# Patient Record
Sex: Female | Born: 1975 | Hispanic: Yes | Marital: Married | Smoking: Never smoker
Health system: Southern US, Community
[De-identification: ages and names within clinical notes are randomized; demographics above are authoritative.]

## PROBLEM LIST (undated history)

## (undated) DIAGNOSIS — J069 Acute upper respiratory infection, unspecified: Secondary | ICD-10-CM

## (undated) DIAGNOSIS — K219 Gastro-esophageal reflux disease without esophagitis: Secondary | ICD-10-CM

## (undated) DIAGNOSIS — T7840XA Allergy, unspecified, initial encounter: Secondary | ICD-10-CM

## (undated) DIAGNOSIS — R011 Cardiac murmur, unspecified: Secondary | ICD-10-CM

## (undated) DIAGNOSIS — J45909 Unspecified asthma, uncomplicated: Secondary | ICD-10-CM

## (undated) DIAGNOSIS — Z87898 Personal history of other specified conditions: Secondary | ICD-10-CM

## (undated) DIAGNOSIS — G43909 Migraine, unspecified, not intractable, without status migrainosus: Secondary | ICD-10-CM

## (undated) DIAGNOSIS — K589 Irritable bowel syndrome without diarrhea: Secondary | ICD-10-CM

## (undated) DIAGNOSIS — L509 Urticaria, unspecified: Secondary | ICD-10-CM

## (undated) HISTORY — DX: Gastro-esophageal reflux disease without esophagitis: K21.9

## (undated) HISTORY — PX: CYSTECTOMY: SUR359

## (undated) HISTORY — DX: Migraine, unspecified, not intractable, without status migrainosus: G43.909

## (undated) HISTORY — DX: Acute upper respiratory infection, unspecified: J06.9

## (undated) HISTORY — DX: Cardiac murmur, unspecified: R01.1

## (undated) HISTORY — DX: Unspecified asthma, uncomplicated: J45.909

## (undated) HISTORY — DX: Irritable bowel syndrome, unspecified: K58.9

## (undated) HISTORY — DX: Allergy, unspecified, initial encounter: T78.40XA

## (undated) HISTORY — PX: WISDOM TOOTH EXTRACTION: SHX21

## (undated) HISTORY — DX: Personal history of other specified conditions: Z87.898

## (undated) HISTORY — DX: Urticaria, unspecified: L50.9

---

## 1998-01-26 ENCOUNTER — Emergency Department (HOSPITAL_COMMUNITY): Admission: EM | Admit: 1998-01-26 | Discharge: 1998-01-26 | Payer: Self-pay | Admitting: Emergency Medicine

## 1998-01-27 ENCOUNTER — Inpatient Hospital Stay (HOSPITAL_COMMUNITY): Admission: RE | Admit: 1998-01-27 | Discharge: 1998-01-27 | Payer: Self-pay | Admitting: *Deleted

## 1998-02-05 ENCOUNTER — Ambulatory Visit (HOSPITAL_COMMUNITY): Admission: RE | Admit: 1998-02-05 | Discharge: 1998-02-05 | Payer: Self-pay | Admitting: Obstetrics

## 1999-03-04 ENCOUNTER — Other Ambulatory Visit: Admission: RE | Admit: 1999-03-04 | Discharge: 1999-03-04 | Payer: Self-pay | Admitting: *Deleted

## 1999-11-17 ENCOUNTER — Encounter: Admission: RE | Admit: 1999-11-17 | Discharge: 1999-11-17 | Payer: Self-pay | Admitting: Family Medicine

## 1999-11-17 ENCOUNTER — Encounter: Payer: Self-pay | Admitting: Family Medicine

## 2000-10-19 ENCOUNTER — Other Ambulatory Visit: Admission: RE | Admit: 2000-10-19 | Discharge: 2000-10-19 | Payer: Self-pay | Admitting: *Deleted

## 2002-05-15 ENCOUNTER — Encounter (HOSPITAL_COMMUNITY): Admission: AD | Admit: 2002-05-15 | Discharge: 2002-05-17 | Payer: Self-pay | Admitting: Obstetrics

## 2002-05-17 ENCOUNTER — Inpatient Hospital Stay (HOSPITAL_COMMUNITY): Admission: AD | Admit: 2002-05-17 | Discharge: 2002-05-19 | Payer: Self-pay | Admitting: Obstetrics

## 2003-03-17 ENCOUNTER — Encounter: Admission: RE | Admit: 2003-03-17 | Discharge: 2003-03-17 | Payer: Self-pay | Admitting: Family Medicine

## 2003-03-17 ENCOUNTER — Encounter: Payer: Self-pay | Admitting: Family Medicine

## 2003-05-29 ENCOUNTER — Encounter: Admission: RE | Admit: 2003-05-29 | Discharge: 2003-05-29 | Payer: Self-pay | Admitting: Family Medicine

## 2003-05-29 ENCOUNTER — Encounter: Payer: Self-pay | Admitting: Family Medicine

## 2004-08-27 ENCOUNTER — Encounter: Admission: RE | Admit: 2004-08-27 | Discharge: 2004-08-27 | Payer: Self-pay | Admitting: Family Medicine

## 2007-11-30 ENCOUNTER — Encounter: Admission: RE | Admit: 2007-11-30 | Discharge: 2007-11-30 | Payer: Self-pay | Admitting: Family Medicine

## 2008-02-05 ENCOUNTER — Encounter: Admission: RE | Admit: 2008-02-05 | Discharge: 2008-02-05 | Payer: Self-pay | Admitting: Family Medicine

## 2009-08-08 HISTORY — PX: ABDOMINAL HYSTERECTOMY: SHX81

## 2010-02-02 ENCOUNTER — Ambulatory Visit: Payer: Self-pay | Admitting: Nurse Practitioner

## 2010-03-09 ENCOUNTER — Encounter (INDEPENDENT_AMBULATORY_CARE_PROVIDER_SITE_OTHER): Payer: Self-pay | Admitting: Obstetrics and Gynecology

## 2010-03-09 ENCOUNTER — Ambulatory Visit (HOSPITAL_COMMUNITY): Admission: RE | Admit: 2010-03-09 | Discharge: 2010-03-10 | Payer: Self-pay | Admitting: Obstetrics and Gynecology

## 2010-04-09 LAB — HM PAP SMEAR: HM PAP: 0

## 2010-06-09 LAB — HM MAMMOGRAPHY: HM Mammogram: 0

## 2010-08-03 ENCOUNTER — Ambulatory Visit: Payer: Self-pay | Admitting: Nurse Practitioner

## 2010-09-17 ENCOUNTER — Ambulatory Visit (HOSPITAL_BASED_OUTPATIENT_CLINIC_OR_DEPARTMENT_OTHER): Payer: 59 | Attending: Family Medicine

## 2010-09-17 DIAGNOSIS — G4730 Sleep apnea, unspecified: Secondary | ICD-10-CM | POA: Insufficient documentation

## 2010-09-17 DIAGNOSIS — G4700 Insomnia, unspecified: Secondary | ICD-10-CM | POA: Insufficient documentation

## 2010-09-18 DIAGNOSIS — G4700 Insomnia, unspecified: Secondary | ICD-10-CM

## 2010-09-18 DIAGNOSIS — G4730 Sleep apnea, unspecified: Secondary | ICD-10-CM

## 2010-10-05 ENCOUNTER — Encounter: Payer: Self-pay | Admitting: Nurse Practitioner

## 2010-10-19 ENCOUNTER — Encounter (INDEPENDENT_AMBULATORY_CARE_PROVIDER_SITE_OTHER): Payer: 59 | Admitting: Nurse Practitioner

## 2010-10-19 DIAGNOSIS — G43109 Migraine with aura, not intractable, without status migrainosus: Secondary | ICD-10-CM

## 2010-10-22 LAB — CBC
Hemoglobin: 10.9 g/dL — ABNORMAL LOW (ref 12.0–15.0)
MCH: 33.4 pg (ref 26.0–34.0)
Platelets: 133 10*3/uL — ABNORMAL LOW (ref 150–400)
RBC: 3.27 MIL/uL — ABNORMAL LOW (ref 3.87–5.11)
WBC: 14.1 10*3/uL — ABNORMAL HIGH (ref 4.0–10.5)

## 2010-10-22 LAB — PREGNANCY, URINE: Preg Test, Ur: NEGATIVE

## 2010-10-23 LAB — CBC
Hemoglobin: 13.3 g/dL (ref 12.0–15.0)
Platelets: 148 10*3/uL — ABNORMAL LOW (ref 150–400)
RBC: 4.01 MIL/uL (ref 3.87–5.11)
WBC: 5.8 10*3/uL (ref 4.0–10.5)

## 2010-10-23 LAB — SURGICAL PCR SCREEN
MRSA, PCR: NEGATIVE
Staphylococcus aureus: NEGATIVE

## 2010-12-21 NOTE — Assessment & Plan Note (Signed)
NAMEIDAMAE, COCCIA NO.:  [id number]   MEDICAL RECORD NO.:  [id number]         PATIENT TYPE:  WAMB   LOCATION:  [facility] at [facility]          FACILITY:  [facility]   PHYSICIAN:  Allie Bossier, MD        DATE OF BIRTH:  Jan 03, 1976   DATE OF SERVICE:  02/02/2010                                  CLINIC NOTE   The patient comes to office today for consultation on her migraine  headaches.  The patient has been having migraine without aura for  approximately 2 years.  She remembers just having tension headaches  prior to that.  When we reviewed her history, she has been having  headaches for longer than that, and she had a CT scan in 2009.  Her  headaches are located in both temples, right predominantly, as well as  the occipital region.  She has approximately 1-2 severe per month, 9  moderate per month, and the rest of the days, she has a mild.  She does  have an everyday headache.  She has been taking Relpax, ibuprofen for  her headaches.   PAST MEDICAL HISTORY:  She has asthma, heart disease, murmur.   She takes QVAR and Proventil   SOCIAL HISTORY:  She works outside of the home.  She has 2 children.  She is married, has 2 jobs.  For birth control, the patient uses  vasectomy.   Sleep is an issue for this patient.  She goes to sleep at 10:30, but she  perseverates over her the things that she must do the next day and often  is up until 1 a.m., sometimes as late as 3 a.m.  She then falls asleep  and feels unable to get up in the morning, having to switch off all  alarm clocks.  Her menstrual cycle is irregular, but light.   ALLERGIES:  To SHELLFISH.   First day of last menstrual period January 26, 2010.   PHYSICAL EXAMINATION:  GENERAL:  Well-developed, well-nourished 34-year-  old [place] female, in no acute distress.  The patient is alert,  oriented.  She is not anxious.  She is well coordinated with good muscle  tone and sensation.  HEENT:  Head is  normocephalic and atraumatic.  Pupils equal and  reactive.  CARDIAC:  Regular rate and rhythm.  LUNGS:  Clear bilaterally.   ASSESSMENT:  1. Chronic daily headache.  2. Migraine without aura.  3. Insomnia.   We had a very lengthy discussion today.  Demetri does not like to take  medications on a daily basis and feels that she does that very poorly.  We talked for a very long time about treating her insomnia in the hopes  that may end her daily headaches.  With that in mind, we are going to  trial her on Ambien 10 mg 1/2-1 tablet q.h.s. #20 with 2 refills, and  she will be given a prescription today for a refill on her Relpax.  We  will give her exactly 1 month to see if this works.  If it does not  work, then at her next office visit, we will have to insist that she  start on a headache preventative.  She will return to the clinic in 4  weeks if not sooner.      Remonia Richter, NP    ______________________________  Allie Bossier, MD    LR/MEDQ  D:  02/02/2010  T:  02/03/2010  Job:  [id number]

## 2010-12-21 NOTE — Assessment & Plan Note (Signed)
Lorraine, Wu NO.:  [id number]   MEDICAL RECORD NO.:  [id number]          PATIENT TYPE:  POB   LOCATION:  [facility] at [facility]         FACILITY:  [facility]   PHYSICIAN:  Lorraine Bossier, MD        DATE OF BIRTH:  1976/05/03   DATE OF SERVICE:  08/03/2010                                  CLINIC NOTE   The patient comes to the office today for follow up on her migraine  headaches.  The patient was last seen on February 02, 2010, for her original  consultation.  Since then, she has had a hysterectomy in August 2011.  She has her tubes and her ovaries remaining.  She has not had any  hormone replacement.  She did not feel that the Ambien was particularly  helpful.  It did not work at 5 mg, it worked once at 10 mg and did not  work later at 10 mg.  She did do research study.  She feels that in a 1-  month period she has three severe headaches.  Those headaches seem to  last for several days, ongoing.  She has a little bit of difficulty  describing the length of her headache at days.  It could be that she is  not completely ending her headaches.  She may be under treating.   PHYSICAL EXAMINATION:  VITAL SIGNS:  Blood pressure is 112/57, pulse 62,  weight 124, height is 5 feet.  GENERAL:  Well-developed, well-nourished 35 year old Hispanic female in  no acute distress.  HEENT:  Head is normocephalic and atraumatic.  Pupils equal and react.  NEUROLOGIC:  The patient is alert, oriented.  She has good muscle tone  and coordination and sensation.   ASSESSMENT:  1. Migraine.  2. Insomnia.   PLAN:  1. From the perspective of her headaches, it appears that she is under      treating her headaches.  She is encouraged to be more aggressive in      treating her headaches and adding Motrin to her Relpax.  We had a      good discussion on this today, and I believe she understands the      importance of early treatment.  We will also give her Zanaflex 4 mg      one-half to 1  tablet q.4 h. p.r.n. muscle spasm #30 with one      refill.  The patient will return to the office in 8 weeks or sooner      as needed.  2. Insomnia.  We will order a sleep study today.  She will set that up      at her convenience.      Lorraine Richter, NP    ______________________________  Lorraine Bossier, MD    LR/MEDQ  D:  08/03/2010  T:  08/04/2010  Job:  [id number]

## 2011-02-18 NOTE — Assessment & Plan Note (Unsigned)
Lorraine Wu, PARISI NO.:  [id number]  MEDICAL RECORD NO.:  [id number]          PATIENT TYPE:  POB  LOCATION:  [facility] at [facility]         FACILITY:  [facility]  PHYSICIAN:  Allie Bossier, MD        DATE OF BIRTH:  1976/06/14  DATE OF SERVICE:  10/19/2010                                 CLINIC NOTE  The patient comes to the office today for a followup on her migraine headaches.  The patient was last seen in December 2011.  Since then she has gotten her sleep study done which was normal.  She has been more aggressively treating her headaches and that has worked well for her. She has only had 2 severe headaches in the last 1 month.  She feels that it has again significantly helped to treat early.  She has only had to use Zanaflex 3 times in the last month to treat some muscle spasm. Overall she is doing very well.  PHYSICAL EXAM:  VITAL SIGNS:  Well-developed, well-nourished, 35 year old Hispanic female in no acute distress. VITAL SIGNS:  Blood pressure is 105/65, pulse 71. HEENT:  Head is normocephalic and atraumatic.  Pupils equal and react. NEUROLOGIC:  The patient is alert, oriented with good coordination and good sensation and good muscle tone.  ASSESSMENT:  Migraine.  PLAN:  The patient will get a refill on her Relpax 40 mg 1 p.o. p.r.n. migraine, #9 with 11 refills.  She again is applauded for doing very well and seems to have learned how to manage her headaches.  We will see her now on a yearly basis unless she needs to be seen sooner.     Remonia Richter, NP   ______________________________ Allie Bossier, MD   LR/MEDQ  D:  10/19/2010  T:  10/19/2010  Job:  [id number]

## 2011-03-30 ENCOUNTER — Telehealth: Payer: Self-pay

## 2011-03-30 NOTE — Telephone Encounter (Signed)
Bonita Quin wants [place] to call in some rescue meds for this patient this is what she would like called in Vicodin ES 1 po prn migraine #20 no refills, Phenergan 25 mg 1 po x 6 hrs prn severe migraine #20 0 refills. Please call them in to [facility].

## 2011-03-30 NOTE — Telephone Encounter (Signed)
Meds were called in as directed to Rob at [facility].

## 2011-09-13 ENCOUNTER — Telehealth: Payer: Self-pay

## 2011-09-13 NOTE — Telephone Encounter (Signed)
Patient put in a request a week before christmas 2012 and ask that we fax her employer a copy of her tither and proof of her mmr booster please fax this info to [phone] her employer is [employer]

## 2012-01-31 ENCOUNTER — Ambulatory Visit: Payer: Self-pay | Admitting: Obstetrics and Gynecology

## 2012-05-16 ENCOUNTER — Telehealth: Payer: Self-pay

## 2012-05-16 NOTE — Telephone Encounter (Signed)
Patient called to make a fu appointment with Lorraine Wu she is scheduled for 06/19/12 but she is running out of her meds. Requested a refill just to get her through to her appointment. Per Inetta Fermo we called in Relpax 40 mg #9 no refills and Ibuprofen 800 mg # 60 no refills. Called it in to [place].

## 2012-06-09 LAB — HM MAMMOGRAPHY: HM MAMMO: 0

## 2012-06-19 ENCOUNTER — Ambulatory Visit (INDEPENDENT_AMBULATORY_CARE_PROVIDER_SITE_OTHER): Payer: 59 | Admitting: Nurse Practitioner

## 2012-06-19 ENCOUNTER — Encounter: Payer: Self-pay | Admitting: Nurse Practitioner

## 2012-06-19 VITALS — BP 109/62 | HR 66 | Ht 60.0 in | Wt 130.0 lb

## 2012-06-19 DIAGNOSIS — M542 Cervicalgia: Secondary | ICD-10-CM

## 2012-06-19 DIAGNOSIS — H5710 Ocular pain, unspecified eye: Secondary | ICD-10-CM

## 2012-06-19 DIAGNOSIS — G43909 Migraine, unspecified, not intractable, without status migrainosus: Secondary | ICD-10-CM

## 2012-06-19 DIAGNOSIS — G43009 Migraine without aura, not intractable, without status migrainosus: Secondary | ICD-10-CM | POA: Insufficient documentation

## 2012-06-19 MED ORDER — VENLAFAXINE HCL ER 75 MG PO CP24: 75.0000 mg | ORAL_CAPSULE | Freq: Every day | ORAL | Status: DC

## 2012-06-19 MED ORDER — ELETRIPTAN HYDROBROMIDE 40 MG PO TABS: 40.0000 mg | ORAL_TABLET | ORAL | Status: DC | PRN

## 2012-06-19 NOTE — Patient Instructions (Addendum)
Migraine Headache A migraine headache is an intense, throbbing pain on one or both sides of your head. A migraine can last for 30 minutes to several hours. CAUSES  The exact cause of a migraine headache is not always known. However, a migraine may be caused when nerves in the brain become irritated and release chemicals that cause inflammation. This causes pain. SYMPTOMS  Pain on one or both sides of your head.  Pulsating or throbbing pain.  Severe pain that prevents daily activities.  Pain that is aggravated by any physical activity.  Nausea, vomiting, or both.  Dizziness.  Pain with exposure to bright lights, loud noises, or activity.  General sensitivity to bright lights, loud noises, or smells. Before you get a migraine, you may get warning signs that a migraine is coming (aura). An aura may include:  Seeing flashing lights.  Seeing bright spots, halos, or zig-zag lines.  Having tunnel vision or blurred vision.  Having feelings of numbness or tingling.  Having trouble talking.  Having muscle weakness. MIGRAINE TRIGGERS  Alcohol.  Smoking.  Stress.  Menstruation.  Aged cheeses.  Foods or drinks that contain nitrates, glutamate, aspartame, or tyramine.  Lack of sleep.  Chocolate.  Caffeine.  Hunger.  Physical exertion.  Fatigue.  Medicines used to treat chest pain (nitroglycerine), birth control pills, estrogen, and some blood pressure medicines. DIAGNOSIS  A migraine headache is often diagnosed based on:  Symptoms.  Physical examination.  A CT scan or MRI of your head. TREATMENT Medicines may be given for pain and nausea. Medicines can also be given to help prevent recurrent migraines.  HOME CARE INSTRUCTIONS  Only take over-the-counter or prescription medicines for pain or discomfort as directed by your caregiver. The use of long-term narcotics is not recommended.  Lie down in a dark, quiet room when you have a migraine.  Keep a journal  to find out what may trigger your migraine headaches. For example, write down:  What you eat and drink.  How much sleep you get.  Any change to your diet or medicines.  Limit alcohol consumption.  Quit smoking if you smoke.  Get 7 to 9 hours of sleep, or as recommended by your caregiver.  Limit stress.  Keep lights dim if bright lights bother you and make your migraines worse. SEEK IMMEDIATE MEDICAL CARE IF:   Your migraine becomes severe.  You have a fever.  You have a stiff neck.  You have vision loss.  You have muscular weakness or loss of muscle control.  You start losing your balance or have trouble walking.  You feel faint or pass out.  You have severe symptoms that are different from your first symptoms. MAKE SURE YOU:   Understand these instructions.  Will watch your condition.  Will get help right away if you are not doing well or get worse. Document Released: 07/25/2005 Document Revised: 10/17/2011 Document Reviewed: 07/15/2011 ExitCare Patient Information 2013 [employer].  

## 2012-06-19 NOTE — Progress Notes (Signed)
S: Pt is here for follow up on migraine headaches. She has not been seen in over one year. She was doing well up until a few months ago when she and her husband separated and her house is going on the market and she is planning to quit her job and move away. She has been taking Zanaflex prn. She wants to take a daily preventative and would like trigger point injections today.  O: Head and neck: Bilateral occipital pain, traps bilateral tenderness  Procedure: Trigger point injections: 5cc total, 2cc lidocaine, 2 cc marcaine, 1 cc prednisone. Approximately 1 cc injected in each area of bilar occipital nerves and bilat traps.Pt tolerated procedure well.  A: Migraine  Occipital Nerve Pain  P: Refill Relpax Start Effexor 75 mg daily Trigger point injections today/ ice tonight RTC 6 weeks

## 2012-07-24 ENCOUNTER — Encounter: Payer: Self-pay | Admitting: Nurse Practitioner

## 2012-07-24 ENCOUNTER — Ambulatory Visit (INDEPENDENT_AMBULATORY_CARE_PROVIDER_SITE_OTHER): Payer: 59 | Admitting: Nurse Practitioner

## 2012-07-24 VITALS — BP 104/64 | HR 64 | Ht 60.0 in | Wt 127.0 lb

## 2012-07-24 DIAGNOSIS — G43909 Migraine, unspecified, not intractable, without status migrainosus: Secondary | ICD-10-CM

## 2012-07-24 MED ORDER — VENLAFAXINE HCL ER 75 MG PO CP24: 75.0000 mg | ORAL_CAPSULE | Freq: Every day | ORAL | Status: DC

## 2012-07-24 NOTE — Progress Notes (Signed)
S: Pt is in office today to follow up on migraine headaches. She felt the trigger point injections helped to break her headache cycle.  She has done very well with Effexor as a preventative . She has not had to use her Relpax at all since starting it. It does cause her some nausea and we discussed her taking it with supper. It seems she and her husband are trying to work out their problems now.   O: Alert, oriented, NAD Neuro: Negative  A: Migraine  P: Refill Effexor 75mg  daily Continue Relpax as needed RTC one year or prn

## 2012-07-24 NOTE — Patient Instructions (Signed)
Migraine Headache A migraine headache is an intense, throbbing pain on one or both sides of your head. A migraine can last for 30 minutes to several hours. CAUSES  The exact cause of a migraine headache is not always known. However, a migraine may be caused when nerves in the brain become irritated and release chemicals that cause inflammation. This causes pain. SYMPTOMS  Pain on one or both sides of your head.  Pulsating or throbbing pain.  Severe pain that prevents daily activities.  Pain that is aggravated by any physical activity.  Nausea, vomiting, or both.  Dizziness.  Pain with exposure to bright lights, loud noises, or activity.  General sensitivity to bright lights, loud noises, or smells. Before you get a migraine, you may get warning signs that a migraine is coming (aura). An aura may include:  Seeing flashing lights.  Seeing bright spots, halos, or zig-zag lines.  Having tunnel vision or blurred vision.  Having feelings of numbness or tingling.  Having trouble talking.  Having muscle weakness. MIGRAINE TRIGGERS  Alcohol.  Smoking.  Stress.  Menstruation.  Aged cheeses.  Foods or drinks that contain nitrates, glutamate, aspartame, or tyramine.  Lack of sleep.  Chocolate.  Caffeine.  Hunger.  Physical exertion.  Fatigue.  Medicines used to treat chest pain (nitroglycerine), birth control pills, estrogen, and some blood pressure medicines. DIAGNOSIS  A migraine headache is often diagnosed based on:  Symptoms.  Physical examination.  A CT scan or MRI of your head. TREATMENT Medicines may be given for pain and nausea. Medicines can also be given to help prevent recurrent migraines.  HOME CARE INSTRUCTIONS  Only take over-the-counter or prescription medicines for pain or discomfort as directed by your caregiver. The use of long-term narcotics is not recommended.  Lie down in a dark, quiet room when you have a migraine.  Keep a journal  to find out what may trigger your migraine headaches. For example, write down:  What you eat and drink.  How much sleep you get.  Any change to your diet or medicines.  Limit alcohol consumption.  Quit smoking if you smoke.  Get 7 to 9 hours of sleep, or as recommended by your caregiver.  Limit stress.  Keep lights dim if bright lights bother you and make your migraines worse. SEEK IMMEDIATE MEDICAL CARE IF:   Your migraine becomes severe.  You have a fever.  You have a stiff neck.  You have vision loss.  You have muscular weakness or loss of muscle control.  You start losing your balance or have trouble walking.  You feel faint or pass out.  You have severe symptoms that are different from your first symptoms. MAKE SURE YOU:   Understand these instructions.  Will watch your condition.  Will get help right away if you are not doing well or get worse. Document Released: 07/25/2005 Document Revised: 10/17/2011 Document Reviewed: 07/15/2011 ExitCare Patient Information 2013 [employer].  

## 2012-09-27 ENCOUNTER — Telehealth: Payer: Self-pay | Admitting: *Deleted

## 2012-09-27 ENCOUNTER — Telehealth: Payer: Self-pay

## 2012-09-27 DIAGNOSIS — G43909 Migraine, unspecified, not intractable, without status migrainosus: Secondary | ICD-10-CM

## 2012-09-27 MED ORDER — PROMETHAZINE HCL 25 MG PO TABS: 25.0000 mg | ORAL_TABLET | Freq: Four times a day (QID) | ORAL | Status: DC | PRN

## 2012-09-27 NOTE — Telephone Encounter (Signed)
Patient is having a bad migraine that is causing nausea and vomiting, Lorraine Wu has authorized phenergan 25 mg to be called into her pharmacy.  Patients son will pick up rx for her.

## 2012-09-27 NOTE — Telephone Encounter (Signed)
Called in Imitrex to her [facility] pharmacy per [employer]

## 2013-02-14 ENCOUNTER — Other Ambulatory Visit: Payer: Self-pay | Admitting: *Deleted

## 2013-02-14 ENCOUNTER — Telehealth: Payer: Self-pay | Admitting: *Deleted

## 2013-02-14 DIAGNOSIS — G43909 Migraine, unspecified, not intractable, without status migrainosus: Secondary | ICD-10-CM

## 2013-02-14 MED ORDER — IBUPROFEN 800 MG PO TABS: 800.0000 mg | ORAL_TABLET | Freq: Three times a day (TID) | ORAL | Status: DC | PRN

## 2013-02-14 NOTE — Telephone Encounter (Signed)
Refill req from pharm rcvd today via fax - sent 1 refill auth to pharm with no refills

## 2013-02-14 NOTE — Telephone Encounter (Signed)
error 

## 2013-05-02 ENCOUNTER — Other Ambulatory Visit: Payer: Self-pay | Admitting: Plastic Surgery

## 2013-05-02 DIAGNOSIS — L723 Sebaceous cyst: Secondary | ICD-10-CM

## 2014-01-14 ENCOUNTER — Encounter: Payer: Self-pay | Admitting: Nurse Practitioner

## 2014-01-14 ENCOUNTER — Ambulatory Visit (INDEPENDENT_AMBULATORY_CARE_PROVIDER_SITE_OTHER): Payer: 59 | Admitting: Nurse Practitioner

## 2014-01-14 VITALS — BP 114/73 | HR 66 | Ht 60.0 in | Wt 146.0 lb

## 2014-01-14 DIAGNOSIS — G43909 Migraine, unspecified, not intractable, without status migrainosus: Secondary | ICD-10-CM

## 2014-01-14 DIAGNOSIS — G43009 Migraine without aura, not intractable, without status migrainosus: Secondary | ICD-10-CM

## 2014-01-14 MED ORDER — PROMETHAZINE HCL 25 MG PO TABS: 25.0000 mg | ORAL_TABLET | Freq: Four times a day (QID) | ORAL | Status: DC | PRN

## 2014-01-14 MED ORDER — KETOROLAC TROMETHAMINE 60 MG/2ML IM SOLN
60.0000 mg | Freq: Once | INTRAMUSCULAR | Status: AC
  Administered 2014-01-14: 60 mg via INTRAMUSCULAR

## 2014-01-14 MED ORDER — RIZATRIPTAN BENZOATE 10 MG PO TABS: 10.0000 mg | ORAL_TABLET | ORAL | Status: DC | PRN

## 2014-01-14 NOTE — Patient Instructions (Signed)
Migraine Headache A migraine headache is an intense, throbbing pain on one or both sides of your head. A migraine can last for 30 minutes to several hours. CAUSES  The exact cause of a migraine headache is not always known. However, a migraine may be caused when nerves in the brain become irritated and release chemicals that cause inflammation. This causes pain. Certain things may also trigger migraines, such as:  Alcohol.  Smoking.  Stress.  Menstruation.  Aged cheeses.  Foods or drinks that contain nitrates, glutamate, aspartame, or tyramine.  Lack of sleep.  Chocolate.  Caffeine.  Hunger.  Physical exertion.  Fatigue.  Medicines used to treat chest pain (nitroglycerine), birth control pills, estrogen, and some blood pressure medicines. SIGNS AND SYMPTOMS  Pain on one or both sides of your head.  Pulsating or throbbing pain.  Severe pain that prevents daily activities.  Pain that is aggravated by any physical activity.  Nausea, vomiting, or both.  Dizziness.  Pain with exposure to bright lights, loud noises, or activity.  General sensitivity to bright lights, loud noises, or smells. Before you get a migraine, you may get warning signs that a migraine is coming (aura). An aura may include:  Seeing flashing lights.  Seeing bright spots, halos, or zig-zag lines.  Having tunnel vision or blurred vision.  Having feelings of numbness or tingling.  Having trouble talking.  Having muscle weakness. DIAGNOSIS  A migraine headache is often diagnosed based on:  Symptoms.  Physical exam.  A CT scan or MRI of your head. These imaging tests cannot diagnose migraines, but they can help rule out other causes of headaches. TREATMENT Medicines may be given for pain and nausea. Medicines can also be given to help prevent recurrent migraines.  HOME CARE INSTRUCTIONS  Only take over-the-counter or prescription medicines for pain or discomfort as directed by your  health care provider. The use of long-term narcotics is not recommended.  Lie down in a dark, quiet room when you have a migraine.  Keep a journal to find out what may trigger your migraine headaches. For example, write down:  What you eat and drink.  How much sleep you get.  Any change to your diet or medicines.  Limit alcohol consumption.  Quit smoking if you smoke.  Get 7 9 hours of sleep, or as recommended by your health care provider.  Limit stress.  Keep lights dim if bright lights bother you and make your migraines worse. SEEK IMMEDIATE MEDICAL CARE IF:   Your migraine becomes severe.  You have a fever.  You have a stiff neck.  You have vision loss.  You have muscular weakness or loss of muscle control.  You start losing your balance or have trouble walking.  You feel faint or pass out.  You have severe symptoms that are different from your first symptoms. MAKE SURE YOU:   Understand these instructions.  Will watch your condition.  Will get help right away if you are not doing well or get worse. Document Released: 07/25/2005 Document Revised: 05/15/2013 Document Reviewed: 04/01/2013 ExitCare Patient Information 2014 [employer].  

## 2014-01-14 NOTE — Progress Notes (Signed)
History:  Lorraine Wu is a 38 y.o. [id number] who presents to [facility] clinic today for follow up on migraines. She has an acute migraine today.  She has not been seen in over one year due to being in migraine study. Things have been going well until about 2 months ago and she  Started having migraine daily or every other day. She has been limited by insurance company to 4 Imitrex tablets per month. She is interested in prevention. 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  The following portions of the patient's history were reviewed and updated as appropriate: allergies, current medications, past family history, past medical history, past social history, past surgical history and problem list.  Review of Systems:  Pertinent items are noted in HPI.  Objective:  Physical Exam BP 114/73  Pulse 66  Ht 5' (1.524 m)  Wt 146 lb (66.225 kg)  BMI 28.51 kg/m2 GENERAL: Well-developed, well-nourished female in no acute distress.  HEENT: Normocephalic, atraumatic.  NECK: Supple. Normal thyroid.  LUNGS: Normal rate. Clear to auscultation bilaterally.  HEART: Regular rate and rhythm with no adventitious sounds.  EXTREMITIES: No cyanosis, clubbing, or edema, 2+ distal pulses.   Labs and Imaging No results found.  Assessment & Plan:  Assessment:  Migraine without Aura  Plans:  Toradol 60 mg IM now Discussed preventatives and chose Trokendi. Will start with one week 25 mg and then up to 50 mg. Samples given. Refill Phenergan Discontinue Imitrex and start Maxalt Follow up in 3 months or prn  Delbert Phenix, NP 01/14/2014 4:36 PM

## 2014-01-27 ENCOUNTER — Telehealth: Payer: Self-pay | Admitting: *Deleted

## 2014-01-27 MED ORDER — TOPIRAMATE ER 50 MG PO CAP24: 50.0000 mg | ORAL_CAPSULE | Freq: Every day | ORAL | Status: DC

## 2014-01-27 NOTE — Telephone Encounter (Signed)
Patient called, her rx for trokendi did not go to the pharmacy and she would like for us to call this in for her.  I have called in a months supply with three refills.

## 2014-02-04 ENCOUNTER — Telehealth: Payer: Self-pay | Admitting: *Deleted

## 2014-02-04 NOTE — Telephone Encounter (Signed)
Lorraine Wu called to let us know that her insurance is not covering the Trokendi at all, since it is not covered the coupon does work either.  She wants to know if you have any suggestions for her because she does feel like it is working well so far.

## 2014-02-06 ENCOUNTER — Telehealth: Payer: Self-pay | Admitting: *Deleted

## 2014-02-06 DIAGNOSIS — G43911 Migraine, unspecified, intractable, with status migrainosus: Secondary | ICD-10-CM

## 2014-02-06 MED ORDER — TOPIRAMATE 25 MG PO CPSP: ORAL_CAPSULE | ORAL | Status: DC

## 2014-02-06 MED ORDER — TRAMADOL HCL 50 MG PO TABS: 50.0000 mg | ORAL_TABLET | Freq: Four times a day (QID) | ORAL | Status: DC | PRN

## 2014-02-06 NOTE — Telephone Encounter (Signed)
Patient is having a migraine and is not able to continue the Trokendi due to cost as it is not covered by her insurance.  [facility] and she has advised me to call in generic topamax for the patient and Ultram for her immediate headache.  I have left a message for the patient to call us if this does not work for her she can call the office for more options.

## 2014-04-22 ENCOUNTER — Encounter: Payer: 59 | Admitting: Nurse Practitioner

## 2014-05-15 ENCOUNTER — Other Ambulatory Visit: Payer: Self-pay | Admitting: Family Medicine

## 2014-05-15 DIAGNOSIS — R0789 Other chest pain: Secondary | ICD-10-CM

## 2014-05-20 ENCOUNTER — Encounter: Payer: Self-pay | Admitting: Nurse Practitioner

## 2014-05-20 ENCOUNTER — Ambulatory Visit (INDEPENDENT_AMBULATORY_CARE_PROVIDER_SITE_OTHER): Payer: 59 | Admitting: Nurse Practitioner

## 2014-05-20 VITALS — BP 111/76 | HR 73 | Ht 60.0 in | Wt 144.0 lb

## 2014-05-20 DIAGNOSIS — G43009 Migraine without aura, not intractable, without status migrainosus: Secondary | ICD-10-CM

## 2014-05-20 MED ORDER — LEVETIRACETAM 250 MG PO TABS: 250.0000 mg | ORAL_TABLET | Freq: Two times a day (BID) | ORAL | Status: DC

## 2014-05-20 NOTE — Patient Instructions (Signed)

## 2014-05-20 NOTE — Progress Notes (Signed)
History:  Kimberlee Nearingiryam Rosario-Riveras a 38 y.o. [id number] who presents to [facility] today for follow up with migraine headaches. She had side effects with Topamax including insomnia, and some peripheral shadowing. Her insurance will not pay for Trokendi. 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 Things are going well at home.   The following portions of the patient's history were reviewed and updated as appropriate: allergies, current medications, past family history, past medical history, past social history, past surgical history and problem list.  Review of Systems:    Objective:  Physical Exam BP 111/76  Pulse 73  Ht 5' (1.524 m)  Wt 144 lb (65.318 kg)  BMI 28.12 kg/m2 GENERAL: Well-developed, well-nourished female in no acute distress.  HEENT: Normocephalic, atraumatic.  NECK: Supple. Normal thyroid.  LUNGS: Normal rate. Clear to auscultation bilaterally. Small wheeze HEART: Regular rate and rhythm with no adventitious sounds. Has murmur EXTREMITIES: No cyanosis, clubbing, or edema, 2+ distal pulses.   Labs and Imaging No results found.  Assessment & Plan:  Assessment:  Migraine without Aura  Plans: Trial Keppra 250 mg po Q HS RTC 2 months or prn   Delbert PhenixLinda M Yeriel Mineo, NP 05/20/2014 3:18 PM

## 2014-05-23 ENCOUNTER — Ambulatory Visit
Admission: RE | Admit: 2014-05-23 | Discharge: 2014-05-23 | Disposition: A | Discharge status: Home / Self Care | Payer: 59 | Source: Ambulatory Visit | Attending: Family Medicine | Admitting: Family Medicine

## 2014-05-23 DIAGNOSIS — R0789 Other chest pain: Secondary | ICD-10-CM

## 2014-06-09 ENCOUNTER — Encounter: Payer: Self-pay | Admitting: Nurse Practitioner

## 2014-07-22 ENCOUNTER — Encounter: Payer: Self-pay | Admitting: Nurse Practitioner

## 2014-07-22 ENCOUNTER — Ambulatory Visit (INDEPENDENT_AMBULATORY_CARE_PROVIDER_SITE_OTHER): Payer: 59 | Admitting: Nurse Practitioner

## 2014-07-22 VITALS — BP 93/63 | HR 59 | Ht 60.0 in | Wt 144.8 lb

## 2014-07-22 DIAGNOSIS — G43009 Migraine without aura, not intractable, without status migrainosus: Secondary | ICD-10-CM

## 2014-07-22 MED ORDER — PROMETHAZINE HCL 25 MG RE SUPP: 25.0000 mg | Freq: Four times a day (QID) | RECTAL | Status: DC | PRN

## 2014-07-22 MED ORDER — SUMATRIPTAN SUCCINATE 100 MG PO TABS: 100.0000 mg | ORAL_TABLET | Freq: Once | ORAL | Status: DC | PRN

## 2014-07-22 MED ORDER — LEVETIRACETAM 250 MG PO TABS: 250.0000 mg | ORAL_TABLET | Freq: Two times a day (BID) | ORAL | Status: DC

## 2014-07-22 NOTE — Progress Notes (Signed)
History:  Lorraine Wu is a 38 y.o. [id number] who presents to clinic today for follow up on migraine. She has been taking the Keppra and doing very well. She only has 2 migraines last month. With one of those she was vomiting and unable to hold down phenergan.  She has started Creed for her IBS and that also seems to be working well. She is doing well in Nursing School.   The following portions of the patient's history were reviewed and updated as appropriate: allergies, current medications, past family history, past medical history, past social history, past surgical history and problem list.  Review of Systems:    Objective:  Physical Exam BP 93/63 mmHg  Pulse 59  Ht 5' (1.524 m)  Wt 144 lb 12.8 oz (65.681 kg)  BMI 28.28 kg/m2 GENERAL: Well-developed, well-nourished female in no acute distress.  HEENT: Normocephalic, atraumatic.  NECK: Supple. Normal thyroid.  LUNGS: Normal rate. Clear to auscultation bilaterally.  HEART: Regular rate and rhythm with no adventitious sounds.  EXTREMITIES: No cyanosis, clubbing, or edema, 2+ distal pulses.   Labs and Imaging No results found.  Assessment & Plan:  Assessment:  Migraine with ou Aura  Plans:  Add Phenergan suppositories Refill Keppra and Imitrex RTC 6 months or prn  Lorraine PhenixLinda M Elion Hocker, NP 07/22/2014 1:02 PM

## 2014-07-22 NOTE — Patient Instructions (Signed)

## 2014-07-22 NOTE — Progress Notes (Signed)
Patient is here to follow up on her headaches.  She has recently had a visit with a new gastroenterologist and placed on a new IBS medication called careen.  It is working well.

## 2014-08-12 ENCOUNTER — Ambulatory Visit: Payer: 59 | Admitting: Internal Medicine

## 2014-09-11 ENCOUNTER — Ambulatory Visit (INDEPENDENT_AMBULATORY_CARE_PROVIDER_SITE_OTHER): Payer: 59 | Admitting: Internal Medicine

## 2014-09-11 ENCOUNTER — Encounter: Payer: Self-pay | Admitting: Internal Medicine

## 2014-09-11 ENCOUNTER — Encounter (INDEPENDENT_AMBULATORY_CARE_PROVIDER_SITE_OTHER): Payer: Self-pay

## 2014-09-11 VITALS — BP 108/78 | HR 78 | Temp 98.1°F | Ht 60.5 in | Wt 142.0 lb

## 2014-09-11 DIAGNOSIS — G43019 Migraine without aura, intractable, without status migrainosus: Secondary | ICD-10-CM

## 2014-09-11 DIAGNOSIS — J4520 Mild intermittent asthma, uncomplicated: Secondary | ICD-10-CM

## 2014-09-11 DIAGNOSIS — E162 Hypoglycemia, unspecified: Secondary | ICD-10-CM

## 2014-09-11 DIAGNOSIS — K589 Irritable bowel syndrome without diarrhea: Secondary | ICD-10-CM

## 2014-09-11 NOTE — Assessment & Plan Note (Signed)
Albuterol prn She will let me know if albuterol use becomes more frequent or consistant

## 2014-09-11 NOTE — Progress Notes (Signed)
HPI  Pt presents to the clinic today to establish care and for management of the conditions listed below. She last saw her previous PCP [facility] physician Dr. Hyacinth Wu last year.  Pt reports frustration w/ Dr. Rondel Wu office so wanted to try new provider.    1. Migraine w/out aura; Keppra, tramadol - working well; phenergan supp used when waking up with migraine; tizanidine - stiffness during migraine 2. Asthma - intermittent. Ventolin use varies; some weeks not at all and others several times 3. Heart murmur -sometimes heard, sometimes not 4. Irritable Bowel Syndrome; Creon - working very well - has f/u with GI specialist Dr. Clerance Wu tomorrow 6. Hypoglycemia - reports AM episodes of dizziness, shakiness, and sweatiness; has had glucose monitored by nurse at work during episodes - readings of 58 and 62. She reports her first meal is not until about 10:30 or 11:00 because if she eats too early, she gets nauseated. Of note, pt reports her mother and sister both have DM.   Flu: 04/2014 - adm at work - Lorraine Wu. Tetanus: 5-6 years ago at Dr. Rondel Wu office Pneumo Vacc: 5-6 years ago due to asthmatic exacerbations LMP: 02/2010; G3P2 (12 yo son, 13 yo son) Pap Smear: 03/2010 - hysterectomy; has ovaries Last mammogram: 12/2011 Dentist: 07/2014 - cleaning every 6 mos  Review of Systems:   Past Medical History  Diagnosis Date  . Heart murmur   . Migraine   . History of insomnia   . Asthma     Family History  Problem Relation Age of Onset  . Depression Mother   . Hypertension Mother   . Diabetes Mother   . Anxiety disorder Mother   . Rheum arthritis Mother   . Cancer Mother     colon  . Bipolar disorder Mother   . Hypertension Father   . Heart disease Father     enlarge  . Hyperlipidemia Father   . Thyroid nodules Sister   . Diabetes Sister   . Hypertension Sister     History   Social History  . Marital Status: Married    Spouse Name: N/A    Number of Children: N/A  .  Years of Education: N/A   Occupational History  . Not on file.   Social History Main Topics  . Smoking status: Never Smoker   . Smokeless tobacco: Not on file  . Alcohol Use: 0.0 oz/week    0 Not specified per week     Comment: rare  . Drug Use: No  . Sexual Activity:    Partners: Male    Birth Control/ Protection: Surgical     Comment: hysterectomy   Other Topics Concern  . Not on file   Social History Narrative    Allergies  Allergen Reactions  . Shellfish Allergy Hives, Shortness Of Breath and Swelling  . Penicillins Hives and Swelling   Constitutional: Denies headache, fatigue, fever, and abrupt weight changes.  HEENT:  Denies sore throat, eye redness, eye pain, pressure behind the eyes, facial pain, nasal congestion, ear pain, ringing in the ears, wax buildup, runny nose or bloody nose. Respiratory: Denies cough, difficulty breathing or shortness of breath.  Cardiovascular: Denies chest pain, chest tightness, palpitations or swelling in the hands or feet. Endocrine: Positive shakiness and sweatiness.  Gastrointestinal: Denies abdominal pain, bloating, constipation, diarrhea or blood in the stool.  GU: Denies frequency, urgency, pain with urination, blood in urine, odor or vaginal discharge. Musculoskeletal: Denies decrease in range of motion, difficulty with  gait, muscle pain or joint pain and swelling.  Skin: Denies redness, rashes, lesions or ulcercations.  Neurological: Denies difficulty with memory, difficulty with speech or problems with balance and coordination.  Psych: Pt denies anxiety, depression, SI/HI.  No other specific complaints in a complete review of systems (except as listed in HPI above).  Objective:   BP 108/78 mmHg  Pulse 78  Temp(Src) 98.1 F (36.7 C) (Oral)  Ht 5' 0.5" (1.537 m)  Wt 142 lb (64.411 kg)  BMI 27.27 kg/m2  SpO2 99% Wt Readings from Last 3 Encounters:  09/11/14 142 lb (64.411 kg)  07/22/14 144 lb 12.8 oz (65.681 kg)   05/20/14 144 lb (65.318 kg)   General: [name] is pleasant, appears her stated age, well developed, well nourished in NAD. HEENT: Head: normal shape and size; Eyes: sclera white, no icterus, conjunctiva pink, PERRLA and EOMs intact; Ears: Tm's gray and intact, normal light reflex; Nose: mucosa pink and moist, septum midline; Throat/Mouth: Teeth present, mucosa erythematous and moist, no exudate noted, no lesions or ulcerations noted. No PND. Neck: Neck supple, trachea midline. No lymphadenopathy, masses, lumps or thyromegaly present.  Cardiovascular: Normal rate and rhythm. S1,S2 noted. Murmur noted. No rubs or gallops. No JVD or BLE edema. No carotid bruits noted. Pulmonary/Chest: Normal effort and positive vesicular breath sounds. No respiratory distress. No wheezes, rales or ronchi noted. Neurological: Alert and oriented. Cranial nerves II-XII grossly intact. Coordination normal.  Psychiatric: Mood and affect normal. Behavior is normal. Judgment and thought content normal.    Assessment & Plan:   Health Maintenance:  Encouraged her to continue healthy lifestyle of diet and exercise Declines CBC, CMET, Lipid profile and A1C today. Continue to monitor symptoms for hypoglycemia. Mammogram not up to date- will address this at her physical exam  Hypoglycemic episodes:  She declines labs today Will check labs at physical exam Advised her to eat every 2-3 hours during the day to prevent hypoglycemia  RTC in the next month for physical exam and labs or sooner if symptoms of hypoglycemia worsen.

## 2014-09-11 NOTE — Assessment & Plan Note (Signed)
Doing well on Creon Continue to follow with your GI doctor

## 2014-09-11 NOTE — Assessment & Plan Note (Signed)
Controlled with Keppra Ocassional use of Tramadol and Phenergan

## 2014-09-11 NOTE — Patient Instructions (Signed)
Hypoglycemia °Hypoglycemia occurs when the glucose in your blood is too low. Glucose is a type of sugar that is your body's main energy source. Hormones, such as insulin and glucagon, control the level of glucose in the blood. Insulin lowers blood glucose and glucagon increases blood glucose. Having too much insulin in your blood stream, or not eating enough food containing sugar, can result in hypoglycemia. Hypoglycemia can happen to people with or without diabetes. It can develop quickly and can be a medical emergency.  °CAUSES  °· Missing or delaying meals. °· Not eating enough carbohydrates at meals. °· Taking too much diabetes medicine. °· Not timing your oral diabetes medicine or insulin doses with meals, snacks, and exercise. °· Nausea and vomiting. °· Certain medicines. °· Severe illnesses, such as hepatitis, kidney disorders, and certain eating disorders. °· Increased activity or exercise without eating something extra or adjusting medicines. °· Drinking too much alcohol. °· A nerve disorder that affects body functions like your heart rate, blood pressure, and digestion (autonomic neuropathy). °· A condition where the stomach muscles do not function properly (gastroparesis). Therefore, medicines and food may not absorb properly. °· Rarely, a tumor of the pancreas can produce too much insulin. °SYMPTOMS  °· Hunger. °· Sweating (diaphoresis). °· Change in body temperature. °· Shakiness. °· Headache. °· Anxiety. °· Lightheadedness. °· Irritability. °· Difficulty concentrating. °· Dry mouth. °· Tingling or numbness in the hands or feet. °· Restless sleep or sleep disturbances. °· Altered speech and coordination. °· Change in mental status. °· Seizures or prolonged convulsions. °· Combativeness. °· Drowsiness (lethargic). °· Weakness. °· Increased heart rate or palpitations. °· Confusion. °· Pale, gray skin color. °· Blurred or double vision. °· Fainting. °DIAGNOSIS  °A physical exam and medical history will be  performed. Your caregiver may make a diagnosis based on your symptoms. Blood tests and other lab tests may be performed to confirm a diagnosis. Once the diagnosis is made, your caregiver will see if your signs and symptoms go away once your blood glucose is raised.  °TREATMENT  °Usually, you can easily treat your hypoglycemia when you notice symptoms. °· Check your blood glucose. If it is less than 70 mg/dl, take one of the following:   °¨ 3-4 glucose tablets.   °¨ ½ cup juice.   °¨ ½ cup regular soda.   °¨ 1 cup skim milk.   °¨ ½-1 tube of glucose gel.   °¨ 5-6 hard candies.   °· Avoid high-fat drinks or food that may delay a rise in blood glucose levels. °· Do not take more than the recommended amount of sugary foods, drinks, gel, or tablets. Doing so will cause your blood glucose to go too high.   °· Wait 10-15 minutes and recheck your blood glucose. If it is still less than 70 mg/dl or below your target range, repeat treatment.   °· Eat a snack if it is more than 1 hour until your next meal.   °There may be a time when your blood glucose may go so low that you are unable to treat yourself at home when you start to notice symptoms. You may need someone to help you. You may even faint or be unable to swallow. If you cannot treat yourself, someone will need to bring you to the hospital.  °HOME CARE INSTRUCTIONS °· If you have diabetes, follow your diabetes management plan by: °¨ Taking your medicines as directed. °¨ Following your exercise plan. °¨ Following your meal plan. Do not skip meals. Eat on time. °¨ Testing your blood   glucose regularly. Check your blood glucose before and after exercise. If you exercise longer or different than usual, be sure to check blood glucose more frequently. °¨ Wearing your medical alert jewelry that says you have diabetes. °· Identify the cause of your hypoglycemia. Then, develop ways to prevent the recurrence of hypoglycemia. °· Do not take a hot bath or shower right after an  insulin shot. °· Always carry treatment with you. Glucose tablets are the easiest to carry. °· If you are going to drink alcohol, drink it only with meals. °· Tell friends or family members ways to keep you safe during a seizure. This may include removing hard or sharp objects from the area or turning you on your side. °· Maintain a healthy weight. °SEEK MEDICAL CARE IF:  °· You are having problems keeping your blood glucose in your target range. °· You are having frequent episodes of hypoglycemia. °· You feel you might be having side effects from your medicines. °· You are not sure why your blood glucose is dropping so low. °· You notice a change in vision or a new problem with your vision. °SEEK IMMEDIATE MEDICAL CARE IF:  °· Confusion develops. °· A change in mental status occurs. °· The inability to swallow develops. °· Fainting occurs. °Document Released: 07/25/2005 Document Revised: 07/30/2013 Document Reviewed: 11/21/2011 °Exit[employer]® Patient Information ©2015 [employer]. This information is not intended to replace advice given to you by your health care provider. Make sure you discuss any questions you have with your health care provider. ° °

## 2014-09-11 NOTE — Progress Notes (Signed)
Pre visit review using our clinic review tool, if applicable. No additional management support is needed unless otherwise documented below in the visit note. 

## 2014-10-20 ENCOUNTER — Ambulatory Visit (INDEPENDENT_AMBULATORY_CARE_PROVIDER_SITE_OTHER): Payer: 59 | Admitting: Internal Medicine

## 2014-10-20 ENCOUNTER — Encounter: Payer: Self-pay | Admitting: Internal Medicine

## 2014-10-20 VITALS — BP 102/66 | HR 57 | Temp 98.3°F | Ht 60.5 in | Wt 143.0 lb

## 2014-10-20 DIAGNOSIS — N644 Mastodynia: Secondary | ICD-10-CM

## 2014-10-20 DIAGNOSIS — Z0000 Encounter for general adult medical examination without abnormal findings: Secondary | ICD-10-CM

## 2014-10-20 NOTE — Addendum Note (Signed)
Addended by: Baldomero LamyHAVERS, Shatia Sindoni C on: 10/20/2014 04:20 PM   Modules accepted: Kipp BroodSmartSet

## 2014-10-20 NOTE — Patient Instructions (Signed)

## 2014-10-20 NOTE — Progress Notes (Signed)
Pre visit review using our clinic review tool, if applicable. No additional management support is needed unless otherwise documented below in the visit note. 

## 2014-10-20 NOTE — Progress Notes (Signed)
Subjective:    Patient ID: Lorraine Wu, female    DOB: 11-16-1975, 39 y.o.   MRN: [id number]  HPI  Pt presents to the clinic today for annual exam.  Flu: 04/2014 Tetanus: 2012 Pneumovax: 2012 Pap Smear: ? 2011, she has had a partial hysterectomy Mammogram: 06/2012 Dentist: biannually  She does have some concerns about pain/burning sensation in her right breast. She reports this started 2 weeks ago. The pain was dull but constant. The burning sensation was intermittent but sharp from the top of the breast all the way to the nipple. The pain/burning sensation only lasted for about 2 days then resolved. She denies any discharge from the nipple. She has not had any trauma to the chest. Her last mammogram was in 2013. She does have a family history of breast cancer.  Diet: Eats whatever she wants. Exercise: Waking for 30 min-1 hour at the gym 3-4 days per week.  Review of Systems      Past Medical History  Diagnosis Date  . Heart murmur   . Migraine   . History of insomnia   . Asthma   . IBS (irritable bowel syndrome)     Current Outpatient Prescriptions  Medication Sig Dispense Refill  . ibuprofen (ADVIL,MOTRIN) 800 MG tablet Take 1 tablet (800 mg total) by mouth every 8 (eight) hours as needed for pain. 30 tablet 0  . levETIRAcetam (KEPPRA) 250 MG tablet Take 1 tablet (250 mg total) by mouth 2 (two) times daily. (Patient taking differently: Take 250 mg by mouth at bedtime. ) 60 tablet 11  . lipase/protease/amylase (CREON) 12000 UNITS CPEP capsule Take 12,000 Units by mouth 3 (three) times daily before meals.    . promethazine (PHENERGAN) 25 MG suppository Place 1 suppository (25 mg total) rectally every 6 (six) hours as needed for nausea or vomiting. 12 each 0  . tiZANidine (ZANAFLEX) 4 MG tablet Take 4 mg by mouth every 6 (six) hours as needed for muscle spasms.    . traMADol (ULTRAM) 50 MG tablet Take 1 tablet (50 mg total) by mouth every 6 (six) hours as needed. 60  tablet 0  . VENTOLIN HFA 108 (90 BASE) MCG/ACT inhaler      No current facility-administered medications for this visit.    Allergies  Allergen Reactions  . Shellfish Allergy Hives, Shortness Of Breath and Swelling  . Penicillins Hives and Swelling    Family History  Problem Relation Age of Onset  . Depression Mother   . Hypertension Mother   . Diabetes Mother   . Anxiety disorder Mother   . Rheum arthritis Mother   . Cancer Mother     colon  . Bipolar disorder Mother   . Hypertension Father   . Heart disease Father     enlarge  . Hyperlipidemia Father   . Thyroid nodules Sister   . Diabetes Sister   . Hypertension Sister     History   Social History  . Marital Status: Married    Spouse Name: N/A  . Number of Children: N/A  . Years of Education: N/A   Occupational History  . Not on file.   Social History Main Topics  . Smoking status: Never Smoker   . Smokeless tobacco: Not on file  . Alcohol Use: 0.0 oz/week    0 Standard drinks or equivalent per week     Comment: rare  . Drug Use: No  . Sexual Activity:    Partners: Male    Birth  Control/ Protection: Surgical     Comment: hysterectomy   Other Topics Concern  . Not on file   Social History Narrative     Constitutional: Denies fever, malaise, fatigue, headache or abrupt weight changes.  HEENT: Denies eye pain, eye redness, ear pain, ringing in the ears, wax buildup, runny nose, nasal congestion, bloody nose, or sore throat. Respiratory: Denies difficulty breathing, shortness of breath, cough or sputum production.   Cardiovascular: Denies chest pain, chest tightness, palpitations or swelling in the hands or feet.  Gastrointestinal: Denies abdominal pain, bloating, constipation, diarrhea or blood in the stool.  GU: Denies urgency, frequency, pain with urination, burning sensation, blood in urine, odor or discharge. Musculoskeletal: Pt reports left knee pain. Denies decrease in range of motion,  difficulty with gait, muscle pain or joint swelling.  Skin: Denies redness, rashes, lesions or ulcercations.  Neurological: Denies dizziness, difficulty with memory, difficulty with speech or problems with balance and coordination.  Psych: Denies anxiety, depression, SI/HI.  No other specific complaints in a complete review of systems (except as listed in HPI above).  Objective:   Physical Exam  BP 102/66 mmHg  Pulse 57  Temp(Src) 98.3 F (36.8 C) (Oral)  Ht 5' 0.5" (1.537 m)  Wt 143 lb (64.864 kg)  BMI 27.46 kg/m2  SpO2 99% Wt Readings from Last 3 Encounters:  10/20/14 143 lb (64.864 kg)  09/11/14 142 lb (64.411 kg)  07/22/14 144 lb 12.8 oz (65.681 kg)    General: Appears her stated age, well developed, well nourished in NAD. Skin: Warm, dry and intact. No rashes, lesions or ulcerations noted. HEENT: Head: normal shape and size; Eyes: sclera white, no icterus, conjunctiva pink, PERRLA and EOMs intact; Ears: Tm's gray and intact, normal light reflex;Throat/Mouth: Teeth present, mucosa pink and moist, no exudate, lesions or ulcerations noted.  Neck:  Neck supple, trachea midline. No masses, lumps or thyromegaly present.  Cardiovascular: Normal rate and rhythm. S1,S2 noted.  No murmur, rubs or gallops noted. No JVD or BLE edema.  Pulmonary/Chest: Normal effort and positive vesicular breath sounds. No respiratory distress. No wheezes, rales or ronchi noted. Breast symmetrical, fibrocystic changes noted bilaterally, no discrete mass noted. Unable to express nipple discharge. Abdomen: Soft and nontender. Normal bowel sounds, no bruits noted. No distention or masses noted. Liver, spleen and kidneys non palpable. Musculoskeletal: Normal flexion and extension of bilateral knees. No crepitus noted with ROM. Strength 5/5 BUE/BLE. No difficulty with gait.  Neurological: Alert and oriented. Cranial nerves II-XII grossly intact. Coordination normal.  Psychiatric: Mood and affect normal. Behavior  is normal. Judgment and thought content normal.    CBC    Component Value Date/Time   WBC 14.1* 03/10/2010 0520   RBC 3.27* 03/10/2010 0520   HGB 10.9* 03/10/2010 0520   HCT 32.1* 03/10/2010 0520   PLT 133* 03/10/2010 0520   MCV 98.1 03/10/2010 0520   MCH 33.4 03/10/2010 0520   MCHC 34.0 03/10/2010 0520   RDW 13.3 03/10/2010 0520          Assessment & Plan:   Preventative Health Maintenance:  She will make an appt at [facility] for her mammogram Pelvic exam due next year She brought labs from 07/2014, TSH and Lipid- scanned into chart Will check CBC and CMET today Immunizations UTD:  Right breast pain:  Resolved Exam normal She will make appt for screening mammogram  RTC in 1 year or sooner if needed

## 2014-10-21 ENCOUNTER — Encounter: Payer: Self-pay | Admitting: Internal Medicine

## 2014-10-21 LAB — COMPREHENSIVE METABOLIC PANEL
ALBUMIN: 4.3 g/dL (ref 3.5–5.2)
ALT: 15 U/L (ref 0–35)
AST: 18 U/L (ref 0–37)
Alkaline Phosphatase: 54 U/L (ref 39–117)
BUN: 9 mg/dL (ref 6–23)
CALCIUM: 9.4 mg/dL (ref 8.4–10.5)
CHLORIDE: 104 meq/L (ref 96–112)
CO2: 27 mEq/L (ref 19–32)
Creatinine, Ser: 0.75 mg/dL (ref 0.40–1.20)
GFR: 92.51 mL/min (ref >60.00)
Glucose, Bld: 76 mg/dL (ref 70–99)
POTASSIUM: 4.2 meq/L (ref 3.5–5.1)
SODIUM: 136 meq/L (ref 135–145)
TOTAL PROTEIN: 7.1 g/dL (ref 6.0–8.3)
Total Bilirubin: 0.4 mg/dL (ref 0.2–1.2)

## 2014-10-21 LAB — CBC
HEMATOCRIT: 39.2 % (ref 36.0–46.0)
Hemoglobin: 13.4 g/dL (ref 12.0–15.0)
MCHC: 34.3 g/dL (ref 30.0–36.0)
MCV: 92.7 fl (ref 78.0–100.0)
PLATELETS: 206 10*3/uL (ref 150.0–400.0)
RBC: 4.23 Mil/uL (ref 3.87–5.11)
RDW: 13.6 % (ref 11.5–15.5)
WBC: 8.3 10*3/uL (ref 4.0–10.5)

## 2014-10-24 ENCOUNTER — Encounter: Payer: Self-pay | Admitting: Internal Medicine

## 2014-10-27 ENCOUNTER — Encounter: Payer: 59 | Admitting: Internal Medicine

## 2014-11-10 ENCOUNTER — Encounter: Payer: Self-pay | Admitting: Internal Medicine

## 2014-11-10 ENCOUNTER — Ambulatory Visit (INDEPENDENT_AMBULATORY_CARE_PROVIDER_SITE_OTHER): Payer: 59 | Admitting: Internal Medicine

## 2014-11-10 VITALS — BP 108/68 | HR 90 | Temp 97.8°F | Wt 145.0 lb

## 2014-11-10 DIAGNOSIS — J309 Allergic rhinitis, unspecified: Secondary | ICD-10-CM

## 2014-11-10 DIAGNOSIS — R0602 Shortness of breath: Secondary | ICD-10-CM | POA: Diagnosis not present

## 2014-11-10 DIAGNOSIS — R079 Chest pain, unspecified: Secondary | ICD-10-CM

## 2014-11-10 DIAGNOSIS — R0789 Other chest pain: Secondary | ICD-10-CM | POA: Diagnosis not present

## 2014-11-10 DIAGNOSIS — M940 Chondrocostal junction syndrome [Tietze]: Secondary | ICD-10-CM | POA: Diagnosis not present

## 2014-11-10 NOTE — Patient Instructions (Signed)

## 2014-11-10 NOTE — Progress Notes (Signed)
Subjective:    Patient ID: Lorraine Wu, female    DOB: January 10, 1976, 39 y.o.   MRN: [id number]  HPI  Pt presents to the clinic today for follow up of UC. She went to UC 2 days ago for chest pain and shortness of breath. The chest pain started on Saturday. It seemed worse with movement. The pain did not radiate. She describes the pain as very intense and very sore. She denied fever, chills, or cough. She has been having some allergy symptoms, runny nose, nasal congestion, sore throat but has been taking Sudafed for that. She denied any injury to the area but reports she did clean her office vigorously the day before. She reports they did not do a chest xray or ECG. They diagnosed her with costochondritis and advised her to take Ibuprofen every 6 hours. She has been taking 600 mg every 6 hours without any relief. The pain is not worse, it's just not better. She also has a history of reflux but reports this is not the same.  Review of Systems      Past Medical History  Diagnosis Date  . Heart murmur   . Migraine   . History of insomnia   . Asthma   . IBS (irritable bowel syndrome)     Current Outpatient Prescriptions  Medication Sig Dispense Refill  . ibuprofen (ADVIL,MOTRIN) 800 MG tablet Take 1 tablet (800 mg total) by mouth every 8 (eight) hours as needed for pain. 30 tablet 0  . levETIRAcetam (KEPPRA) 250 MG tablet Take 1 tablet (250 mg total) by mouth 2 (two) times daily. (Patient taking differently: Take 250 mg by mouth at bedtime. ) 60 tablet 11  . lipase/protease/amylase (CREON) 12000 UNITS CPEP capsule Take 12,000 Units by mouth 3 (three) times daily before meals.    . promethazine (PHENERGAN) 25 MG suppository Place 1 suppository (25 mg total) rectally every 6 (six) hours as needed for nausea or vomiting. 12 each 0  . tiZANidine (ZANAFLEX) 4 MG tablet Take 4 mg by mouth every 6 (six) hours as needed for muscle spasms.    . traMADol (ULTRAM) 50 MG tablet Take 1 tablet (50  mg total) by mouth every 6 (six) hours as needed. 60 tablet 0  . VENTOLIN HFA 108 (90 BASE) MCG/ACT inhaler      No current facility-administered medications for this visit.    Allergies  Allergen Reactions  . Shellfish Allergy Hives, Shortness Of Breath and Swelling  . Penicillins Hives and Swelling    Family History  Problem Relation Age of Onset  . Depression Mother   . Hypertension Mother   . Diabetes Mother   . Anxiety disorder Mother   . Rheum arthritis Mother   . Cancer Mother     colon  . Bipolar disorder Mother   . Hypertension Father   . Heart disease Father     enlarge  . Hyperlipidemia Father   . Thyroid nodules Sister   . Diabetes Sister   . Hypertension Sister     History   Social History  . Marital Status: Married    Spouse Name: N/A  . Number of Children: N/A  . Years of Education: N/A   Occupational History  . Not on file.   Social History Main Topics  . Smoking status: Never Smoker   . Smokeless tobacco: Not on file  . Alcohol Use: 0.0 oz/week    0 Standard drinks or equivalent per week     Comment:  rare  . Drug Use: No  . Sexual Activity:    Partners: Male    Birth Control/ Protection: Surgical     Comment: hysterectomy   Other Topics Concern  . Not on file   Social History Narrative     Constitutional: Denies fever, malaise, fatigue, headache or abrupt weight changes.  HEENT: Pt reports runny nose, nasal congestion and sore throat. Denies eye pain, eye redness, ear pain, ringing in the ears, wax buildup, bloody nose. Respiratory: Pt reports shortness of breath. Denies difficulty breathing, cough or sputum production.   Cardiovascular: Pt reports chest pain. Denies chest tightness, palpitations or swelling in the hands or feet.  Gastrointestinal: Denies abdominal pain, bloating, constipation, diarrhea or blood in the stool.  Neurological: Denies dizziness, difficulty with memory, difficulty with speech or problems with balance and  coordination.   No other specific complaints in a complete review of systems (except as listed in HPI above).  Objective:   Physical Exam  BP 108/68 mmHg  Pulse 90  Temp(Src) 97.8 F (36.6 C) (Oral)  Wt 145 lb (65.772 kg)  SpO2 99% Wt Readings from Last 3 Encounters:  11/10/14 145 lb (65.772 kg)  10/20/14 143 lb (64.864 kg)  09/11/14 142 lb (64.411 kg)    General: Appears her stated age, well developed, well nourished in NAD. Skin: Warm, dry and intact.  HEENT: Head: normal shape and size; Eyes: sclera white, no icterus, conjunctiva pink, PERRLA and EOMs intact; Ears: Tm's gray and intact, normal light reflex, + serous effusion bilaterally; Nose: mucosa boggy and moist, septum midline; Throat/Mouth: Teeth present, mucosa pink and moist, + PND, no exudate, lesions or ulcerations noted.  Neck: No adenopathy noted. Cardiovascular: Normal rate and rhythm. S1,S2 noted.  No murmur, rubs or gallops noted.  Pulmonary/Chest: Normal effort and positive vesicular breath sounds. No respiratory distress. No wheezes, rales or ronchi noted.  Abdomen: Soft and nontender. Normal bowel sounds, no bruits noted. No distention or masses noted.  Musculoskeletal: Chest wall pain reproduced with palpation. Neurological: Alert and oriented.  Psychiatric: Mood is anxious appearing.  EKG: Normal  BMET    Component Value Date/Time   NA 136 10/20/2014 1518   K 4.2 10/20/2014 1518   CL 104 10/20/2014 1518   CO2 27 10/20/2014 1518   GLUCOSE 76 10/20/2014 1518   BUN 9 10/20/2014 1518   CREATININE 0.75 10/20/2014 1518   CALCIUM 9.4 10/20/2014 1518    Lipid Panel  No results found for: CHOL, TRIG, HDL, CHOLHDL, VLDL, LDLCALC  CBC    Component Value Date/Time   WBC 8.3 10/20/2014 1518   RBC 4.23 10/20/2014 1518   HGB 13.4 10/20/2014 1518   HCT 39.2 10/20/2014 1518   PLT 206.0 10/20/2014 1518   MCV 92.7 10/20/2014 1518   MCH 33.4 03/10/2010 0520   MCHC 34.3 10/20/2014 1518   RDW 13.6  10/20/2014 1518    Hgb A1C No results found for: HGBA1C       Assessment & Plan:   Chest wall pain secondary to costochondritis:  Will request UC records for review Reassured her that this is MSK related and not her heart, it will take time to heal, avoid vigorous activity or heavy lifting ECG was normal No need for chest xray- lungs clear, no cough or fever Advised her to increase Ibuprofen to 800 mg Q8H She has a muscle relaxer at home she could also use at night only if needed  Allergic Rhinitis:  Stop taking the [employer]  taking Zyrtec and Flonase for the next 2 weeks RTC as needed or if symptoms persist or worsen

## 2014-11-10 NOTE — Progress Notes (Signed)
Pre visit review using our clinic review tool, if applicable. No additional management support is needed unless otherwise documented below in the visit note. 

## 2015-01-20 ENCOUNTER — Encounter: Payer: 59 | Admitting: Nurse Practitioner

## 2015-01-27 ENCOUNTER — Ambulatory Visit (INDEPENDENT_AMBULATORY_CARE_PROVIDER_SITE_OTHER): Payer: 59 | Admitting: Physician Assistant

## 2015-01-27 ENCOUNTER — Encounter: Payer: Self-pay | Admitting: Physician Assistant

## 2015-01-27 VITALS — BP 105/72 | HR 65 | Wt 150.0 lb

## 2015-01-27 DIAGNOSIS — G43009 Migraine without aura, not intractable, without status migrainosus: Secondary | ICD-10-CM

## 2015-01-27 MED ORDER — RIZATRIPTAN BENZOATE 10 MG PO TABS: 10.0000 mg | ORAL_TABLET | ORAL | Status: DC | PRN

## 2015-01-27 MED ORDER — LEVETIRACETAM 250 MG PO TABS: 250.0000 mg | ORAL_TABLET | Freq: Every day | ORAL | Status: DC

## 2015-01-27 NOTE — Progress Notes (Signed)
Patient ID: Lorraine Wu, female   DOB: 06/23/1976, 39 y.o.   MRN: [id number] History:  Tyrina Dorame is a 38 y.o. [id number] who presents to clinic today for headache follow up.  3 months ago, she stopped the Keppra.  The dosage was increased and caused extremes: either she was too sleepy or too jittery.  When she went back to the lower dose it went better again but she only had a few tabs at the lower dose before running out.  Now she does not use anything. HA is right occipital and can be mild to severe.  It is pulsatile, worse with movement.  It is associated with photophobia and phonophobia.  With worst HA, nausea and vomiting.  She sometimes has to lie down and sleep for it to go better.  She does not have vision changes or warning that HA is coming.  She does not take Imitrex.  It worked the first couple times but no help with it in several months. Ibuprofen used most often for acute HA.   She notes her sleep has continued to worsen.  She falls asleep well but cannot stay asleep.     HIT6:70 Number of days in the last 4 weeks with:  Severe headache: 2 Moderate headache: 24 Mild headache: 0  No headache: 2   Past Medical History  Diagnosis Date  . Heart murmur   . Migraine   . History of insomnia   . Asthma   . IBS (irritable bowel syndrome)     History   Social History  . Marital Status: Married    Spouse Name: N/A  . Number of Children: N/A  . Years of Education: N/A   Occupational History  . Not on file.   Social History Main Topics  . Smoking status: Never Smoker   . Smokeless tobacco: Not on file  . Alcohol Use: 0.0 oz/week    0 Standard drinks or equivalent per week     Comment: rare  . Drug Use: No  . Sexual Activity:    Partners: Male    Birth Control/ Protection: Surgical     Comment: hysterectomy   Other Topics Concern  . Not on file   Social History Narrative    Family History  Problem Relation Age of Onset  . Depression Mother   .  Hypertension Mother   . Diabetes Mother   . Anxiety disorder Mother   . Rheum arthritis Mother   . Cancer Mother     colon  . Bipolar disorder Mother   . Hypertension Father   . Heart disease Father     enlarge  . Hyperlipidemia Father   . Thyroid nodules Sister   . Diabetes Sister   . Hypertension Sister     Allergies  Allergen Reactions  . Shellfish Allergy Hives, Shortness Of Breath and Swelling  . Penicillins Hives and Swelling    Current Outpatient Prescriptions on File Prior to Visit  Medication Sig Dispense Refill  . ibuprofen (ADVIL,MOTRIN) 800 MG tablet Take 1 tablet (800 mg total) by mouth every 8 (eight) hours as needed for pain. 30 tablet 0  . levETIRAcetam (KEPPRA) 250 MG tablet Take 1 tablet (250 mg total) by mouth 2 (two) times daily. (Patient taking differently: Take 250 mg by mouth at bedtime. ) 60 tablet 11  . lipase/protease/amylase (CREON) 12000 UNITS CPEP capsule Take 12,000 Units by mouth 3 (three) times daily before meals.    . promethazine (PHENERGAN) 25 MG suppository Place  1 suppository (25 mg total) rectally every 6 (six) hours as needed for nausea or vomiting. 12 each 0  . tiZANidine (ZANAFLEX) 4 MG tablet Take 4 mg by mouth every 6 (six) hours as needed for muscle spasms.    . traMADol (ULTRAM) 50 MG tablet Take 1 tablet (50 mg total) by mouth every 6 (six) hours as needed. 60 tablet 0  . VENTOLIN HFA 108 (90 BASE) MCG/ACT inhaler      No current facility-administered medications on file prior to visit.     Review of Systems:  All pertinent positive/negative included in HPI, all other review of systems are negative  Objective:  Physical Exam BP 105/72 mmHg  Pulse 65  Wt 150 lb (68.04 kg) CONSTITUTIONAL: Well-developed, well-nourished female in no acute distress.  EYES: EOM intact ENT: Normocephalic CARDIOVASCULAR: Regular rate and rhythm with no adventitious sounds. RESPIRATORY: Normal rate. Clear to auscultation bilaterally.  ENDOCRINE:  Normal thyroid.  MUSCULOSKELETAL: Normal ROM, strength equal bilaterally SKIN: Warm, dry without erythema  NEUROLOGICAL: Alert, oriented, CN II-XII grossly intact, Appropriate balance PSYCH: Normal behavior, mood   Assessment & Plan:  Assessment: 1. Migraine without aura and without status migrainosus, not intractable    Plans: Restart Keppra -  once daily for HA prevention Will try Maxalt for acute migraine.  If this works well, okay to refill x 6.  If no help with HA, will trial Zomig instead. Follow-up in 6 months or sooner PRN  Bertram Denver, PA-C 01/27/2015 3:06 PM

## 2015-01-27 NOTE — Patient Instructions (Signed)

## 2015-01-29 ENCOUNTER — Telehealth: Payer: Self-pay | Admitting: Physician Assistant

## 2015-01-29 DIAGNOSIS — G43909 Migraine, unspecified, not intractable, without status migrainosus: Secondary | ICD-10-CM

## 2015-01-29 NOTE — Telephone Encounter (Signed)
Telephone call to patient to notify her that her FMLA forms are ready for pick up.  Copy scanned to chart and copy faxed to patient's employer.

## 2015-04-28 ENCOUNTER — Ambulatory Visit (INDEPENDENT_AMBULATORY_CARE_PROVIDER_SITE_OTHER): Payer: 59 | Admitting: Internal Medicine

## 2015-04-28 ENCOUNTER — Encounter (INDEPENDENT_AMBULATORY_CARE_PROVIDER_SITE_OTHER): Payer: Self-pay

## 2015-04-28 ENCOUNTER — Encounter: Payer: Self-pay | Admitting: Internal Medicine

## 2015-04-28 VITALS — BP 112/72 | HR 84 | Temp 98.4°F | Wt 147.5 lb

## 2015-04-28 DIAGNOSIS — R109 Unspecified abdominal pain: Secondary | ICD-10-CM

## 2015-04-28 DIAGNOSIS — IMO0001 Reserved for inherently not codable concepts without codable children: Secondary | ICD-10-CM

## 2015-04-28 DIAGNOSIS — R1032 Left lower quadrant pain: Secondary | ICD-10-CM | POA: Diagnosis not present

## 2015-04-28 DIAGNOSIS — R300 Dysuria: Secondary | ICD-10-CM | POA: Diagnosis not present

## 2015-04-28 DIAGNOSIS — R110 Nausea: Secondary | ICD-10-CM

## 2015-04-28 DIAGNOSIS — R350 Frequency of micturition: Secondary | ICD-10-CM | POA: Diagnosis not present

## 2015-04-28 NOTE — Progress Notes (Signed)
Pre visit review using our clinic review tool, if applicable. No additional management support is needed unless otherwise documented below in the visit note. 

## 2015-04-28 NOTE — Progress Notes (Signed)
Subjective:    Patient ID: Lorraine Wu, female    DOB: June 25, 1976, 39 y.o.   MRN: [id number]  HPI  Pt presents to the clinic today with c/o LLQ abdominal pain. This started 4 days ago. She describes the pain as cramping. It radiates to her left flank.  It has been intermittent. She has had some associated nausea but denies vomiting. She did have some frequency and dysuria over the weekend, and the dysuria seems to have improved. She does have IBS but her bowels are moving normally. She denies diarrhea, constipation or blood in her stool. She has had a hysterectomy secondary to possible endometreosis. She has not taken anything OTC.  Review of Systems  Past Medical History  Diagnosis Date  . Heart murmur   . Migraine   . History of insomnia   . Asthma   . IBS (irritable bowel syndrome)     Current Outpatient Prescriptions  Medication Sig Dispense Refill  . lipase/protease/amylase (CREON) 12000 UNITS CPEP capsule Take 12,000 Units by mouth 3 (three) times daily before meals.    . VENTOLIN HFA 108 (90 BASE) MCG/ACT inhaler      No current facility-administered medications for this visit.    Allergies  Allergen Reactions  . Shellfish Allergy Hives, Shortness Of Breath and Swelling  . Penicillins Hives and Swelling    Family History  Problem Relation Age of Onset  . Depression Mother   . Hypertension Mother   . Diabetes Mother   . Anxiety disorder Mother   . Rheum arthritis Mother   . Cancer Mother     colon  . Bipolar disorder Mother   . Hypertension Father   . Heart disease Father     enlarge  . Hyperlipidemia Father   . Thyroid nodules Sister   . Diabetes Sister   . Hypertension Sister     Social History   Social History  . Marital Status: Married    Spouse Name: N/A  . Number of Children: N/A  . Years of Education: N/A   Occupational History  . Not on file.   Social History Main Topics  . Smoking status: Never Smoker   . Smokeless tobacco:  Not on file  . Alcohol Use: 0.0 oz/week    0 Standard drinks or equivalent per week     Comment: rare  . Drug Use: No  . Sexual Activity:    Partners: Male    Birth Control/ Protection: Surgical     Comment: hysterectomy   Other Topics Concern  . Not on file   Social History Narrative     Constitutional: Denies fever, malaise, fatigue, headache or abrupt weight changes.  Respiratory: Denies difficulty breathing, shortness of breath, cough or sputum production.   Cardiovascular: Denies chest pain, chest tightness, palpitations or swelling in the hands or feet.  Gastrointestinal: Pt reports abdominal pain. Denies bloating, constipation, diarrhea or blood in the stool.  GU: pt reports flank pain, frequency and dysuria. Denies urgency, burning sensation, blood in urine, odor or discharge.   No other specific complaints in a complete review of systems (except as listed in HPI above).     Objective:   Physical Exam  BP 112/72 mmHg  Pulse 84  Temp(Src) 98.4 F (36.9 C) (Oral)  Wt 147 lb 8 oz (66.906 kg)  SpO2 99% Wt Readings from Last 3 Encounters:  04/28/15 147 lb 8 oz (66.906 kg)  01/27/15 150 lb (68.04 kg)  11/10/14 145 lb (65.772 kg)  General: Appears her stated age, in NAD. Cardiovascular: Normal rate and rhythm. S1,S2 noted.  No murmur, rubs or gallops noted.  Pulmonary/Chest: Normal effort and positive vesicular breath sounds. No respiratory distress. No wheezes, rales or ronchi noted.  Abdomen: Soft and tender over the bladder and in the LLQ. Normal bowel sounds, no bruits noted. No distention or masses noted. Liver, spleen and kidneys non palpable. No CVA tenderness noted. Neurological: Alert and oriented.   BMET    Component Value Date/Time   NA 136 10/20/2014 1518   K 4.2 10/20/2014 1518   CL 104 10/20/2014 1518   CO2 27 10/20/2014 1518   GLUCOSE 76 10/20/2014 1518   BUN 9 10/20/2014 1518   CREATININE 0.75 10/20/2014 1518   CALCIUM 9.4 10/20/2014 1518      Lipid Panel  No results found for: CHOL, TRIG, HDL, CHOLHDL, VLDL, LDLCALC  CBC    Component Value Date/Time   WBC 8.3 10/20/2014 1518   RBC 4.23 10/20/2014 1518   HGB 13.4 10/20/2014 1518   HCT 39.2 10/20/2014 1518   PLT 206.0 10/20/2014 1518   MCV 92.7 10/20/2014 1518   MCH 33.4 03/10/2010 0520   MCHC 34.3 10/20/2014 1518   RDW 13.6 10/20/2014 1518    Hgb A1C No results found for: HGBA1C       Assessment & Plan:   LLQ pain, left flank pain, nausea, frequency and dysuria:  Urinalysis: normal Start Ibuprofen BID prn with meals Will obtain ultrasound pelvis/transvaginal to r/o ovarian etiology- see Shirlee Limerick on the way out to schedule  RTC as needed or if symptoms persist or worsen

## 2015-04-28 NOTE — Patient Instructions (Signed)

## 2015-04-29 LAB — POCT URINALYSIS DIPSTICK
Bilirubin, UA: NEGATIVE
Glucose, UA: NEGATIVE
Ketones, UA: NEGATIVE
Leukocytes, UA: NEGATIVE
NITRITE UA: NEGATIVE
PROTEIN UA: NEGATIVE
RBC UA: NEGATIVE
SPEC GRAV UA: 1.025
UROBILINOGEN UA: NEGATIVE
pH, UA: 6

## 2015-04-29 NOTE — Addendum Note (Signed)
Addended by: Roena Malady on: 04/29/2015 11:26 AM   Modules accepted: Orders

## 2015-04-30 ENCOUNTER — Ambulatory Visit
Admission: RE | Admit: 2015-04-30 | Discharge: 2015-04-30 | Disposition: A | Discharge status: Home / Self Care | Payer: 59 | Source: Ambulatory Visit | Attending: Internal Medicine | Admitting: Internal Medicine

## 2015-04-30 DIAGNOSIS — R1032 Left lower quadrant pain: Secondary | ICD-10-CM

## 2015-06-08 ENCOUNTER — Encounter: Payer: Self-pay | Admitting: *Deleted

## 2015-09-09 ENCOUNTER — Ambulatory Visit (INDEPENDENT_AMBULATORY_CARE_PROVIDER_SITE_OTHER): Payer: 59 | Admitting: Internal Medicine

## 2015-09-09 ENCOUNTER — Encounter: Payer: Self-pay | Admitting: Internal Medicine

## 2015-09-09 VITALS — BP 106/64 | HR 78 | Temp 98.5°F

## 2015-09-09 DIAGNOSIS — B349 Viral infection, unspecified: Secondary | ICD-10-CM | POA: Diagnosis not present

## 2015-09-09 DIAGNOSIS — B9789 Other viral agents as the cause of diseases classified elsewhere: Secondary | ICD-10-CM

## 2015-09-09 DIAGNOSIS — J329 Chronic sinusitis, unspecified: Secondary | ICD-10-CM

## 2015-09-09 NOTE — Patient Instructions (Signed)

## 2015-09-09 NOTE — Progress Notes (Signed)
HPI  Pt presents to the clinic today with c/o ear fullness, sore throat, post nasal drip and cough. This started 2 days ago. The cough is mostly non productive, but she did get up blood tinged tan mucous once. She denies runny nose, nasal congestion or cough. She denies fever or chills but has had body aches. She has tried Ibuprofen and Flonase with minimal relief. She has no history of allergies. She has not had sick contacts that she was aware of.  Review of Systems    Past Medical History  Diagnosis Date  . Heart murmur   . Migraine   . History of insomnia   . Asthma   . IBS (irritable bowel syndrome)     Family History  Problem Relation Age of Onset  . Depression Mother   . Hypertension Mother   . Diabetes Mother   . Anxiety disorder Mother   . Rheum arthritis Mother   . Cancer Mother     colon  . Bipolar disorder Mother   . Hypertension Father   . Heart disease Father     enlarge  . Hyperlipidemia Father   . Thyroid nodules Sister   . Diabetes Sister   . Hypertension Sister     Social History   Social History  . Marital Status: Married    Spouse Name: N/A  . Number of Children: N/A  . Years of Education: N/A   Occupational History  . Not on file.   Social History Main Topics  . Smoking status: Never Smoker   . Smokeless tobacco: Not on file  . Alcohol Use: 0.0 oz/week    0 Standard drinks or equivalent per week     Comment: rare  . Drug Use: No  . Sexual Activity:    Partners: Male    Birth Control/ Protection: Surgical     Comment: hysterectomy   Other Topics Concern  . Not on file   Social History Narrative    Allergies  Allergen Reactions  . Shellfish Allergy Hives, Shortness Of Breath and Swelling  . Penicillins Hives and Swelling     Constitutional: Positive headache, fatigue. Denies fever or abrupt weight changes.  HEENT:  Positive ear fullness, sore throat. Denies eye redness, ear pain, ringing in the ears, wax buildup, runny nose or  bloody nose. Respiratory: Denies cough, difficulty breathing or shortness of breath.  Cardiovascular: Denies chest pain, chest tightness, palpitations or swelling in the hands or feet.   No other specific complaints in a complete review of systems (except as listed in HPI above).  Objective:  BP 106/64 mmHg  Pulse 78  Temp(Src) 98.5 F (36.9 C) (Oral)  Wt   SpO2 98%   General: Appears her stated age, in NAD. HEENT: Head: normal shape and size, no sinus tenderness noted; Eyes: sclera white, no icterus, conjunctiva pink; Ears: Tm's gray and intact, normal light reflex; Nose: mucosa pink and moist, septum midline; Throat/Mouth: + PND. Teeth present, mucosa erythematous and moist, no exudate noted, no lesions or ulcerations noted.  Neck:  No cervical adenopathy.  Cardiovascular: Normal rate and rhythm. S1,S2 noted.  No murmur, rubs or gallops noted.  Pulmonary/Chest: Normal effort and positive vesicular breath sounds. No respiratory distress. No wheezes, rales or ronchi noted.      Assessment & Plan:   Acute viral sinusitis  Can use a Neti Pot which can be purchased from your local drug store. Flonase 2 sprays each nostril for 3 days and then as needed.  Mucinex as needed Ibuprofen for sore throat Return precautions given  RTC as needed or if symptoms persist.

## 2015-09-09 NOTE — Progress Notes (Signed)
Pre visit review using our clinic review tool, if applicable. No additional management support is needed unless otherwise documented below in the visit note. 

## 2015-09-11 ENCOUNTER — Ambulatory Visit: Payer: Self-pay | Admitting: Internal Medicine

## 2015-09-14 ENCOUNTER — Encounter: Payer: Self-pay | Admitting: Internal Medicine

## 2015-09-14 ENCOUNTER — Other Ambulatory Visit: Payer: Self-pay | Admitting: Internal Medicine

## 2015-09-14 MED ORDER — AZITHROMYCIN 250 MG PO TABS: ORAL_TABLET | ORAL | Status: DC

## 2015-09-25 DIAGNOSIS — J309 Allergic rhinitis, unspecified: Secondary | ICD-10-CM | POA: Insufficient documentation

## 2015-10-20 ENCOUNTER — Encounter: Payer: Self-pay | Admitting: Internal Medicine

## 2015-12-04 ENCOUNTER — Encounter: Payer: Self-pay | Admitting: Internal Medicine

## 2015-12-04 DIAGNOSIS — R739 Hyperglycemia, unspecified: Secondary | ICD-10-CM

## 2015-12-07 MED ORDER — GLUCOSE BLOOD VI STRP: 1.0000 | ORAL_STRIP | Freq: Two times a day (BID) | Status: DC

## 2015-12-10 ENCOUNTER — Telehealth: Payer: Self-pay | Admitting: Internal Medicine

## 2015-12-10 DIAGNOSIS — R7309 Other abnormal glucose: Secondary | ICD-10-CM

## 2015-12-10 NOTE — Telephone Encounter (Signed)
[facility] They need most recent A1c on file. I do not see that we have ever drawn her A1c. Please enter lab order if you think appropriate?  Referral Form in [place]-box

## 2015-12-14 NOTE — Addendum Note (Signed)
Addended by: Roena MaladyEVONTENNO, MELANIE Y on: 12/14/2015 02:52 PM   Modules accepted: Orders

## 2015-12-14 NOTE — Telephone Encounter (Signed)
This form in in Mel's inbox to call her.

## 2015-12-14 NOTE — Telephone Encounter (Signed)
Left detailed msg on VM per HIPAA Pt needs to come in for lab only appt for hem A1C Lab future ordered

## 2016-03-25 ENCOUNTER — Ambulatory Visit (INDEPENDENT_AMBULATORY_CARE_PROVIDER_SITE_OTHER): Payer: 59 | Admitting: Physician Assistant

## 2016-03-25 ENCOUNTER — Encounter: Payer: Self-pay | Admitting: Physician Assistant

## 2016-03-25 VITALS — BP 104/71 | HR 71 | Resp 18 | Ht 60.0 in | Wt 141.0 lb

## 2016-03-25 DIAGNOSIS — G43009 Migraine without aura, not intractable, without status migrainosus: Secondary | ICD-10-CM | POA: Diagnosis not present

## 2016-03-25 DIAGNOSIS — M62838 Other muscle spasm: Secondary | ICD-10-CM | POA: Insufficient documentation

## 2016-03-25 MED ORDER — RIZATRIPTAN BENZOATE 10 MG PO TABS: 10.0000 mg | ORAL_TABLET | ORAL | 0 refills | Status: DC | PRN

## 2016-03-25 MED ORDER — BACLOFEN 10 MG PO TABS: 10.0000 mg | ORAL_TABLET | Freq: Three times a day (TID) | ORAL | 0 refills | Status: DC

## 2016-03-25 NOTE — Progress Notes (Signed)
History:  Lorraine Wu is a 40 y.o. [id number] who presents to clinic today for HA f/u.  They were better for some time.  She stopped her meds and did not need them during that time.  She has noticed them coming back since July.  When she had started Keppra she felt like she wasn't fully present in her daily life.  She was only using acute medications for the last year.  She cannot tell me what that was exactly - just a "little blue pill."   This past week it was horrible.  She has been exceptionally stressed.     Past Medical History:  Diagnosis Date  . Asthma   . Heart murmur   . History of insomnia   . IBS (irritable bowel syndrome)   . Migraine     Social History   Social History  . Marital status: Married    Spouse name: N/A  . Number of children: N/A  . Years of education: N/A   Occupational History  . Not on file.   Social History Main Topics  . Smoking status: Never Smoker  . Smokeless tobacco: Not on file  . Alcohol use 0.0 oz/week     Comment: rare  . Drug use: No  . Sexual activity: Yes    Partners: Male    Birth control/ protection: Surgical     Comment: hysterectomy   Other Topics Concern  . Not on file   Social History Narrative  . No narrative on file    Family History  Problem Relation Age of Onset  . Depression Mother   . Hypertension Mother   . Diabetes Mother   . Anxiety disorder Mother   . Rheum arthritis Mother   . Cancer Mother     colon  . Bipolar disorder Mother   . Hypertension Father   . Heart disease Father     enlarge  . Hyperlipidemia Father   . Thyroid nodules Sister   . Diabetes Sister   . Hypertension Sister     Allergies  Allergen Reactions  . Shellfish Allergy Hives, Shortness Of Breath and Swelling  . Penicillins Hives and Swelling    Current Outpatient Prescriptions on File Prior to Visit  Medication Sig Dispense Refill  . azithromycin (ZITHROMAX) 250 MG tablet Take 2 tabs today, then 1 tab daily x 4 days  6 tablet 0  . diclofenac (VOLTAREN) 50 MG EC tablet Take 50 mg by mouth 2 (two) times daily as needed.     Marland Kitchen. glucose blood (ACCU-CHEK AVIVA) test strip 1 each by Other route 2 (two) times daily. Use as instructed 100 each 11   No current facility-administered medications on file prior to visit.      Review of Systems:  All pertinent positive/negative included in HPI, all other review of systems are negative  Objective:  Physical Exam BP 104/71 (BP Location: Left Arm, Patient Position: Sitting, Cuff Size: Normal)   Pulse 71   Resp 18   Ht 5' (1.524 m)   Wt 141 lb (64 kg)   BMI 27.54 kg/m  CONSTITUTIONAL: Well-developed, well-nourished female in no acute distress.  EYES: EOM intact ENT: Normocephalic CARDIOVASCULAR: Regular rate and rhythm with no adventitious sounds.  RESPIRATORY: Normal rate. Clear to auscultation bilaterally.  MUSCULOSKELETAL: Normal ROM, strength equal bilaterally, cervical paraspinal and bilat trap  muscle spasm noted SKIN: Warm, dry without erythema  NEUROLOGICAL: Alert, oriented, CN II-XII grossly intact, Appropriate balance PSYCH: Normal behavior, mood  Assessment & Plan:  Assessment: 1. Migraine without aura and without status migrainosus, not intractable   2. Muscle spasm      Plan: Pt has not been compliant with preventive medications and therefore will not plan to trial this visit. Will trial maxalt - never tried before despite prescription one year ago.  Will use for acute migraine.  May combine with OTC NSAID to improve efficacy if needed Baclofen also new medication to pt - use for HA or muscle spasm.  Trial in evening/weekend for first use.  May break in half Follow-up PRN  Bertram DenverKaren E Teague Clark, PA-C 03/25/2016 11:28 AM

## 2016-03-25 NOTE — Patient Instructions (Signed)

## 2016-04-25 ENCOUNTER — Encounter (INDEPENDENT_AMBULATORY_CARE_PROVIDER_SITE_OTHER): Payer: 59 | Admitting: *Deleted

## 2016-04-25 DIAGNOSIS — G43009 Migraine without aura, not intractable, without status migrainosus: Secondary | ICD-10-CM

## 2016-07-14 ENCOUNTER — Ambulatory Visit (INDEPENDENT_AMBULATORY_CARE_PROVIDER_SITE_OTHER): Payer: 59 | Admitting: Psychology

## 2016-07-14 DIAGNOSIS — F411 Generalized anxiety disorder: Secondary | ICD-10-CM | POA: Diagnosis not present

## 2016-07-28 ENCOUNTER — Ambulatory Visit (INDEPENDENT_AMBULATORY_CARE_PROVIDER_SITE_OTHER): Payer: 59 | Admitting: Psychology

## 2016-07-28 DIAGNOSIS — F411 Generalized anxiety disorder: Secondary | ICD-10-CM | POA: Diagnosis not present

## 2016-08-11 ENCOUNTER — Ambulatory Visit (INDEPENDENT_AMBULATORY_CARE_PROVIDER_SITE_OTHER): Payer: 59 | Admitting: Psychology

## 2016-08-11 DIAGNOSIS — F411 Generalized anxiety disorder: Secondary | ICD-10-CM

## 2016-08-25 ENCOUNTER — Ambulatory Visit: Payer: 59 | Admitting: Psychology

## 2016-09-08 ENCOUNTER — Ambulatory Visit (INDEPENDENT_AMBULATORY_CARE_PROVIDER_SITE_OTHER): Payer: 59 | Admitting: Psychology

## 2016-09-08 DIAGNOSIS — F411 Generalized anxiety disorder: Secondary | ICD-10-CM | POA: Diagnosis not present

## 2016-09-15 DIAGNOSIS — G43909 Migraine, unspecified, not intractable, without status migrainosus: Secondary | ICD-10-CM | POA: Insufficient documentation

## 2016-09-22 ENCOUNTER — Ambulatory Visit (INDEPENDENT_AMBULATORY_CARE_PROVIDER_SITE_OTHER): Payer: 59 | Admitting: Psychology

## 2016-09-22 DIAGNOSIS — F411 Generalized anxiety disorder: Secondary | ICD-10-CM | POA: Diagnosis not present

## 2016-10-20 ENCOUNTER — Ambulatory Visit: Payer: 59 | Admitting: Psychology

## 2016-10-20 ENCOUNTER — Ambulatory Visit (INDEPENDENT_AMBULATORY_CARE_PROVIDER_SITE_OTHER): Payer: 59 | Admitting: Psychology

## 2016-10-20 DIAGNOSIS — F411 Generalized anxiety disorder: Secondary | ICD-10-CM

## 2016-11-03 ENCOUNTER — Ambulatory Visit (INDEPENDENT_AMBULATORY_CARE_PROVIDER_SITE_OTHER): Payer: 59 | Admitting: Psychology

## 2016-11-03 DIAGNOSIS — F411 Generalized anxiety disorder: Secondary | ICD-10-CM | POA: Diagnosis not present

## 2016-11-17 ENCOUNTER — Ambulatory Visit (INDEPENDENT_AMBULATORY_CARE_PROVIDER_SITE_OTHER): Payer: 59 | Admitting: Psychology

## 2016-11-17 DIAGNOSIS — F324 Major depressive disorder, single episode, in partial remission: Secondary | ICD-10-CM | POA: Diagnosis not present

## 2016-12-01 ENCOUNTER — Ambulatory Visit (INDEPENDENT_AMBULATORY_CARE_PROVIDER_SITE_OTHER): Payer: 59 | Admitting: Psychology

## 2016-12-01 DIAGNOSIS — F411 Generalized anxiety disorder: Secondary | ICD-10-CM

## 2016-12-29 ENCOUNTER — Ambulatory Visit (INDEPENDENT_AMBULATORY_CARE_PROVIDER_SITE_OTHER): Payer: 59 | Admitting: Psychology

## 2016-12-29 DIAGNOSIS — F411 Generalized anxiety disorder: Secondary | ICD-10-CM | POA: Diagnosis not present

## 2017-01-12 ENCOUNTER — Ambulatory Visit (INDEPENDENT_AMBULATORY_CARE_PROVIDER_SITE_OTHER): Payer: 59 | Admitting: Psychology

## 2017-01-12 DIAGNOSIS — IMO0002 Reserved for concepts with insufficient information to code with codable children: Secondary | ICD-10-CM | POA: Insufficient documentation

## 2017-01-12 DIAGNOSIS — F411 Generalized anxiety disorder: Secondary | ICD-10-CM | POA: Diagnosis not present

## 2017-01-26 ENCOUNTER — Ambulatory Visit (INDEPENDENT_AMBULATORY_CARE_PROVIDER_SITE_OTHER): Payer: 59 | Admitting: Psychology

## 2017-01-26 DIAGNOSIS — F411 Generalized anxiety disorder: Secondary | ICD-10-CM | POA: Diagnosis not present

## 2017-03-09 ENCOUNTER — Ambulatory Visit: Payer: Self-pay | Admitting: Psychology

## 2017-03-23 ENCOUNTER — Ambulatory Visit (INDEPENDENT_AMBULATORY_CARE_PROVIDER_SITE_OTHER): Payer: 59 | Admitting: Psychology

## 2017-03-23 DIAGNOSIS — F411 Generalized anxiety disorder: Secondary | ICD-10-CM

## 2017-04-06 ENCOUNTER — Ambulatory Visit: Payer: 59 | Admitting: Psychology

## 2017-04-20 ENCOUNTER — Ambulatory Visit (INDEPENDENT_AMBULATORY_CARE_PROVIDER_SITE_OTHER): Payer: 59 | Admitting: Psychology

## 2017-04-20 DIAGNOSIS — F411 Generalized anxiety disorder: Secondary | ICD-10-CM | POA: Diagnosis not present

## 2017-05-04 ENCOUNTER — Ambulatory Visit (INDEPENDENT_AMBULATORY_CARE_PROVIDER_SITE_OTHER): Payer: 59 | Admitting: Psychology

## 2017-05-04 DIAGNOSIS — F411 Generalized anxiety disorder: Secondary | ICD-10-CM

## 2017-10-26 DIAGNOSIS — G629 Polyneuropathy, unspecified: Secondary | ICD-10-CM | POA: Insufficient documentation

## 2017-10-26 DIAGNOSIS — R102 Pelvic and perineal pain: Secondary | ICD-10-CM | POA: Insufficient documentation

## 2017-10-26 DIAGNOSIS — G4730 Sleep apnea, unspecified: Secondary | ICD-10-CM | POA: Insufficient documentation

## 2017-10-26 DIAGNOSIS — M7501 Adhesive capsulitis of right shoulder: Secondary | ICD-10-CM | POA: Insufficient documentation

## 2017-10-26 DIAGNOSIS — M1990 Unspecified osteoarthritis, unspecified site: Secondary | ICD-10-CM | POA: Insufficient documentation

## 2017-10-27 ENCOUNTER — Other Ambulatory Visit: Payer: Self-pay | Admitting: Gastroenterology

## 2017-10-27 DIAGNOSIS — R102 Pelvic and perineal pain: Secondary | ICD-10-CM

## 2017-10-31 ENCOUNTER — Ambulatory Visit
Admission: RE | Admit: 2017-10-31 | Discharge: 2017-10-31 | Disposition: A | Discharge status: Home / Self Care | Payer: 59 | Source: Ambulatory Visit | Attending: Gastroenterology | Admitting: Gastroenterology

## 2017-10-31 DIAGNOSIS — R102 Pelvic and perineal pain: Secondary | ICD-10-CM

## 2018-03-07 ENCOUNTER — Encounter: Payer: Self-pay | Admitting: Gastroenterology

## 2018-03-07 ENCOUNTER — Encounter: Payer: Self-pay | Admitting: Allergy and Immunology

## 2018-03-07 ENCOUNTER — Ambulatory Visit: Payer: 59 | Admitting: Allergy and Immunology

## 2018-03-07 VITALS — BP 94/54 | HR 80 | Temp 98.6°F | Resp 18 | Ht 61.0 in | Wt 146.0 lb

## 2018-03-07 DIAGNOSIS — L501 Idiopathic urticaria: Secondary | ICD-10-CM | POA: Diagnosis not present

## 2018-03-07 DIAGNOSIS — K219 Gastro-esophageal reflux disease without esophagitis: Secondary | ICD-10-CM

## 2018-03-07 DIAGNOSIS — J3089 Other allergic rhinitis: Secondary | ICD-10-CM | POA: Diagnosis not present

## 2018-03-07 DIAGNOSIS — J4520 Mild intermittent asthma, uncomplicated: Secondary | ICD-10-CM | POA: Diagnosis not present

## 2018-03-07 DIAGNOSIS — T7800XD Anaphylactic reaction due to unspecified food, subsequent encounter: Secondary | ICD-10-CM

## 2018-03-07 MED ORDER — DEXLANSOPRAZOLE 60 MG PO CPDR: 60.0000 mg | DELAYED_RELEASE_CAPSULE | ORAL | 5 refills | Status: DC

## 2018-03-07 MED ORDER — AUVI-Q 0.3 MG/0.3ML IJ SOAJ: INTRAMUSCULAR | 1 refills | Status: DC

## 2018-03-07 MED ORDER — MONTELUKAST SODIUM 10 MG PO TABS: 10.0000 mg | ORAL_TABLET | Freq: Every day | ORAL | 5 refills | Status: DC

## 2018-03-07 MED ORDER — RANITIDINE HCL 150 MG PO TABS: 150.0000 mg | ORAL_TABLET | Freq: Two times a day (BID) | ORAL | 5 refills | Status: DC

## 2018-03-07 NOTE — Patient Instructions (Addendum)
  1.  Treat immunological overactivity:   A.  Cetirizine 10 mg tablet twice a day  B.  Ranitidine 150 mg tablet twice a day  C.  Montelukast 10 mg tablet once a day  2.  Treat reflux:   A.  Consolidate caffeine to the lowest possible amount  B.  Replace omeprazole with Dexilant 60 mg in a.m.  C.  Can continue OTC Maalox  3.  Blood - CBC w/diff, CMP, TSH, T4, TP, alpha-gal panel, sed, CRP, C3, C4, C1Q  4.  Arrange for GI evaluation for upper endoscopy  5.  If needed:   A.  Auvi-Q 0.3, Benadryl, MD/ER evaluation for allergic reaction  B.  Benadryl for itchiness  C.  Ventolin HFA  6.  Consider discontinuation of progesterone and testosterone and proceeding with oophorectomy  7.  Return to clinic in 3 weeks or earlier if problem

## 2018-03-07 NOTE — Progress Notes (Signed)
Dear Dr. Garnette Gunner,  Thank you for referring Lorraine Wu to the [facility] on 03/07/2018.   Below is a summation of this patient's evaluation and recommendations.  Thank you for your referral. I will keep you informed about this patient's response to treatment.   If you have any questions please do not hesitate to contact me.   Sincerely,  Jiles Prows, MD Allergy / Immunology [place]   ______________________________________________________________________    NEW PATIENT NOTE  Referring Provider: Jearld Fenton, NP Primary Provider: Jearld Fenton, NP Date of office visit: 03/07/2018    Subjective:   Chief Complaint:  Lorraine Wu (DOB: Apr 18, 1976) is a 42 y.o. female who presents to the clinic on 03/07/2018 with a chief complaint of Urticaria and Pruritis .     HPI: Lorraine Wu presents to this clinic in evaluation of hives.  I had seen her in this clinic many years ago for an issue tied up with allergic rhinitis and asthma and food allergy directed against shellfish and reflux.  She has had excellent control of her atopic respiratory disease while consistently using Zyrtec on a daily basis and a very rare use of a short acting bronchodilator.  She has not required a systemic steroid to treat her atopic respiratory disease in years and rarely does she use a short acting bronchodilator and she can exercise without any difficulty.  What is new is the development of red raised itchy lesions involving what sounds like 30% of her body surface area that developed on her last day of the visit to [place] at the end of May.  She used lidocaine cream that night and she did relatively well and came back to the [place] and then she redevelop significant skin reactions for which she went to the urgent care center and was treated with a "shot" and prednisone which  eliminated all of her issue until she stopped using her prednisone and then she has been redeveloping red raised itchy lesions across her body and has a global pruritus even without skin lesions.  She has noticed that in June when she had this problem her skin lesions would come up red last several hours and then the next day she would notice a bruise in that area that would take about 3 to 4 days to completely resolved.  This has not occurred since the first week of July.  She does not have any additional bruising at this point in time but she still continues to have red raised lesions that come up across her body.  She has also developed a "lump" in her throat which is very similar to her previous problems with reflux and regurgitation.  She has a very significant issue with reflux for which she is using over-the-counter Prilosec twice a day and takes Maalox at nighttime.  She continues to consume coffee every morning and eats chocolate during her menstrual periods.  Her reflux may be a little bit more active in association with this issue.  She does have chronic nausea that has been an issue for years.  She has never had an upper endoscopy.  She cannot really identify an obvious trigger that gives rise to this immunological hyperreactivity presenting as urticaria.  However, she was using a DHEA supplement for approximately 1 month prior to the onset of this issue and discontinued this on 31 January 2018.  In addition, she  had doubling of her progesterone dosage and doubling of her testosterone dosage in March 2019.  She is using these agents for "cyst rupture" and she is presently considering undergoing an oophorectomy to deal with this issue.  She has already had a hysterectomy in the past.  She apparently developed some issues with right ankle and left elbow pain or burning earlier this spring and in March she had evaluation for rheumatologic disease with negative blood testing.  She does not consume any  shellfish.  Past Medical History:  Diagnosis Date  . Asthma   . Heart murmur   . History of insomnia   . IBS (irritable bowel syndrome)   . Migraine   . Urticaria     Past Surgical History:  Procedure Laterality Date  . ABDOMINAL HYSTERECTOMY  2011   total hysterectomy  . CYSTECTOMY     02/2013 hand, eyebrow and head 05/2013  . WISDOM TOOTH EXTRACTION     x4    Allergies as of 03/07/2018      Reactions   Shellfish Allergy Hives, Shortness Of Breath, Nausea And Vomiting, Swelling   Throat itching   Penicillins Hives, Swelling      Medication List      CALCIUM PO Take 2,000 Units by mouth daily.   cetirizine 10 MG tablet Commonly known as:  ZYRTEC Take 10 mg by mouth daily.   CREON 12000 units Cpep capsule Generic drug:  lipase/protease/amylase TAKE 2 CAPSULE(S) 3 TIMES A DAY BY ORAL ROUTE BEFORE MEALS FOR 30 DAYS.   hydrOXYzine 25 MG tablet Commonly known as:  ATARAX/VISTARIL Take 25 mg by mouth 3 (three) times daily.   progesterone 100 MG capsule Commonly known as:  PROMETRIUM TAKE TWO CAPSULE BY MOUTH EVERY AT DINNER   TESTOSTERONE COMPOUNDING KIT TD testosterone 20 mg ml  APPLY 4 CLICKS TO SKIN DAILY IN THE MORNING  STORE AT ROOM TEMPERATURE; PROTECT FROM LIGHT  DISCARD AFTER 06 01 19   topiramate 100 MG tablet Commonly known as:  TOPAMAX Take 100 mg by mouth every evening.   traMADol 50 MG tablet Commonly known as:  ULTRAM tramadol 50 mg tablet every 6 hours prn   triamcinolone cream 0.1 % Commonly known as:  KENALOG APPLY THIN COAT TO AFFECTED AREA TWICE A DAY   VENTOLIN HFA IN Inhale 1 puff into the lungs as needed.       Review of systems negative except as noted in HPI / PMHx or noted below:  Review of Systems  Constitutional: Negative.   HENT: Negative.   Eyes: Negative.   Respiratory: Negative.   Cardiovascular: Negative.   Gastrointestinal: Negative.   Genitourinary: Negative.   Musculoskeletal: Negative.   Skin: Negative.     Neurological: Negative.   Endo/Heme/Allergies: Negative.   Psychiatric/Behavioral: Negative.     Family History  Problem Relation Age of Onset  . Depression Mother   . Hypertension Mother   . Diabetes Mother   . Anxiety disorder Mother   . Rheum arthritis Mother   . Cancer Mother        colon  . Bipolar disorder Mother   . Melanoma Mother        skin- removed  . Hypertension Father   . Heart disease Father        enlarge  . Hyperlipidemia Father   . Heart attack Father   . Thyroid nodules Sister   . Diabetes Sister   . Hypertension Sister   . Asthma Sister   .  Depression Sister   . Asthma Son     Social History   Socioeconomic History  . Marital status: Married    Spouse name: Not on file  . Number of children: Not on file  . Years of education: Not on file  . Highest education level: Not on file  Occupational History  . Not on file  Social Needs  . Financial resource strain: Not on file  . Food insecurity:    Worry: Not on file    Inability: Not on file  . Transportation needs:    Medical: Not on file    Non-medical: Not on file  Tobacco Use  . Smoking status: Never Smoker  . Smokeless tobacco: Never Used  Substance and Sexual Activity  . Alcohol use: Yes    Alcohol/week: 0.0 oz    Comment: rare  . Drug use: No  . Sexual activity: Yes    Partners: Male    Birth control/protection: Surgical    Comment: hysterectomy  Lifestyle  . Physical activity:    Days per week: Not on file    Minutes per session: Not on file  . Stress: Not on file  Relationships  . Social connections:    Talks on phone: Not on file    Gets together: Not on file    Attends religious service: Not on file    Active member of club or organization: Not on file    Attends meetings of clubs or organizations: Not on file    Relationship status: Not on file  . Intimate partner violence:    Fear of current or ex partner: Not on file    Emotionally abused: Not on file     Physically abused: Not on file    Forced sexual activity: Not on file  Other Topics Concern  . Not on file  Social History Narrative  . Not on file    Environmental and Social history  Lives in a house with a dry environment, no animals located inside the household, carpet in the bedroom, plastic on the bed, plastic on the pillow, and no smokers located inside the household.  Objective:   Vitals:   03/07/18 0949  BP: (!) 94/54  Pulse: 80  Resp: 18  Temp: 98.6 F (37 C)   Height: 5' 1"  (154.9 cm) Weight: 146 lb (66.2 kg)  Physical Exam  HENT:  Head: Normocephalic. Head is without right periorbital erythema and without left periorbital erythema.  Right Ear: Tympanic membrane, external ear and ear canal normal.  Left Ear: Tympanic membrane, external ear and ear canal normal.  Nose: Nose normal. No mucosal edema or rhinorrhea.  Mouth/Throat: Uvula is midline, oropharynx is clear and moist and mucous membranes are normal. No oropharyngeal exudate.  Eyes: Pupils are equal, round, and reactive to light. Conjunctivae and lids are normal.  Neck: Trachea normal. No tracheal tenderness present. No tracheal deviation present. No thyromegaly present.  Cardiovascular: Normal rate, regular rhythm, S1 normal, S2 normal and normal heart sounds.  No murmur heard. Pulmonary/Chest: Effort normal and breath sounds normal. No stridor. No respiratory distress. She has no wheezes. She has no rales. She exhibits no tenderness.  Abdominal: Soft. She exhibits no distension and no mass. There is no hepatosplenomegaly. There is no tenderness. There is no rebound and no guarding.  Musculoskeletal: She exhibits no edema or tenderness.  Lymphadenopathy:       Head (right side): No tonsillar adenopathy present.       Head (left  side): No tonsillar adenopathy present.    She has no cervical adenopathy.    She has no axillary adenopathy.  Neurological: She is alert.  Skin: No rash noted. She is not  diaphoretic. No erythema. No pallor. Nails show no clubbing.    Diagnostics: Allergy skin tests were not performed.   Results of blood tests obtained 31 January 2018 identified high titer IgE antibodies directed against dermatophagoides pteronyssinus and farinae and high titer IgE antibody directed against Timothy grass and low titer IgE antibodies directed against cat and dog in [place] grass and become.  She also had a low titer of IgE antibodies directed against shrimp at a titer of 0.48 KU/L.  Her total serum IgE level was 206 IU/mL.  Assessment and Plan:    1. Idiopathic urticaria   2. Asthma, mild intermittent, well-controlled   3. Perennial allergic rhinitis   4. 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  Treat immunological overactivity:   A.  Cetirizine 10 mg tablet twice a day  B.  Ranitidine 150 mg tablet twice a day  C.  Montelukast 10 mg tablet once a day  2.  Treat reflux:   A.  Consolidate caffeine to the lowest possible amount  B.  Replace omeprazole with Dexilant 60 mg in a.m.  C.  Can continue OTC Maalox  3.  Blood - CBC w/diff, CMP, TSH, T4, TP, alpha-gal panel, sed, CRP, C3, C4, C1Q  4.  Arrange for GI evaluation for upper endoscopy  5.  If needed:   A.  Auvi-Q 0.3, Benadryl, MD/ER evaluation for allergic reaction  B.  Benadryl for itchiness  C.  Ventolin HFA  6.  Consider discontinuation of progesterone and testosterone and proceeding with oophorectomy  7.  Return to clinic in 3 weeks or earlier if problem  Lorraine Wu has several issues that need attention.  Certainly she has some form of immunological hyperreactivity manifested as pruritus and urticaria and some of her urticarial lesions sounded quite inflammatory with the development of bruising.  Will have her undergo evaluation for a worrisome systemic disease contributing to this immunological hyperactivity with the  blood tests noted above.  She will utilize a collection of medical therapy in an attempt to raise her itch threshold and decrease her urticarial outbreak.  It is quite possible that the use of her hormonal supplements and the use of progesterone and testosterone have contributed to some of this immunological hyperactivity as her supplement was introduced about a month prior to this issue and her increased dose of progesterone and testosterone were also introduced about a month prior to this episode.  It may be best for her to consider preceding with an oophorectomy so that she can eliminate the use of these medications in the future.  As well, she appears to have a chronic GI issue tied up with chronic nausea and very bad reflux that has not responded to very good therapy administered in the past.  I think she would benefit from having an upper endoscopy performed in investigation of this issue and we will try to get that arranged at some point in the near future and I have also manipulated some of her antireflux medications as noted above.  Jiles Prows, MD Allergy / Immunology [facility]

## 2018-03-08 ENCOUNTER — Encounter: Payer: Self-pay | Admitting: Allergy and Immunology

## 2018-03-09 ENCOUNTER — Telehealth: Payer: Self-pay

## 2018-03-09 NOTE — Telephone Encounter (Signed)
Patient is already scheduled for 04/02/18

## 2018-03-09 NOTE — Telephone Encounter (Signed)
-----   Message from Titus MouldHannah Kozlow, CMA sent at 03/07/2018  1:40 PM EDT ----- Hi y'all, can you refer Ayesha to Dr. Adela LankArmbruster ([facility]) for bad reflux?  Thank you:-)  Dahlia ClientHannah

## 2018-03-14 LAB — COMPREHENSIVE METABOLIC PANEL
A/G RATIO: 1.8 (ref 1.2–2.2)
ALK PHOS: 59 IU/L (ref 39–117)
ALT: 17 IU/L (ref 0–32)
AST: 18 IU/L (ref 0–40)
Albumin: 4.4 g/dL (ref 3.5–5.5)
BUN / CREAT RATIO: 9 (ref 9–23)
BUN: 8 mg/dL (ref 6–24)
CHLORIDE: 108 mmol/L — AB (ref 96–106)
CO2: 19 mmol/L — ABNORMAL LOW (ref 20–29)
Calcium: 9.1 mg/dL (ref 8.7–10.2)
Creatinine, Ser: 0.86 mg/dL (ref 0.57–1.00)
GFR calc non Af Amer: 85 mL/min/{1.73_m2} (ref >59)
GFR, EST AFRICAN AMERICAN: 98 mL/min/{1.73_m2} (ref >59)
GLUCOSE: 84 mg/dL (ref 65–99)
Globulin, Total: 2.4 g/dL (ref 1.5–4.5)
POTASSIUM: 4 mmol/L (ref 3.5–5.2)
Sodium: 141 mmol/L (ref 134–144)
TOTAL PROTEIN: 6.8 g/dL (ref 6.0–8.5)

## 2018-03-14 LAB — CBC WITH DIFFERENTIAL/PLATELET
BASOS ABS: 0 10*3/uL (ref 0.0–0.2)
Basos: 0 %
EOS (ABSOLUTE): 0 10*3/uL (ref 0.0–0.4)
Eos: 1 %
Hematocrit: 42.5 % (ref 34.0–46.6)
Hemoglobin: 14.1 g/dL (ref 11.1–15.9)
Immature Grans (Abs): 0 10*3/uL (ref 0.0–0.1)
Immature Granulocytes: 0 %
LYMPHS ABS: 1.6 10*3/uL (ref 0.7–3.1)
LYMPHS: 22 %
MCH: 32 pg (ref 26.6–33.0)
MCHC: 33.2 g/dL (ref 31.5–35.7)
MCV: 96 fL (ref 79–97)
Monocytes Absolute: 0.3 10*3/uL (ref 0.1–0.9)
Monocytes: 5 %
Neutrophils Absolute: 5.2 10*3/uL (ref 1.4–7.0)
Neutrophils: 72 %
PLATELETS: 221 10*3/uL (ref 150–450)
RBC: 4.41 x10E6/uL (ref 3.77–5.28)
RDW: 13.3 % (ref 12.3–15.4)
WBC: 7.3 10*3/uL (ref 3.4–10.8)

## 2018-03-14 LAB — C4 COMPLEMENT: COMPLEMENT C4, SERUM: 23 mg/dL (ref 14–44)

## 2018-03-14 LAB — ALPHA-GAL PANEL
Beef (Bos spp) IgE: <0.1 kU/L (ref <0.35)
Class Interpretation: 0
Class Interpretation: 0
Lamb/Mutton (Ovis spp) IgE: <0.1 kU/L (ref <0.35)
PORK CLASS INTERPRETATION: 0
Pork (Sus spp) IgE: <0.1 kU/L (ref <0.35)

## 2018-03-14 LAB — COMPLEMENT COMPONENT C1Q: Complement C1Q: 11.8 mg/dL (ref 11.8–24.4)

## 2018-03-14 LAB — SEDIMENTATION RATE: SED RATE: 17 mm/h (ref 0–32)

## 2018-03-14 LAB — THYROID PEROXIDASE ANTIBODY

## 2018-03-14 LAB — TSH+FREE T4
Free T4: 0.81 ng/dL — ABNORMAL LOW (ref 0.82–1.77)
TSH: 1.56 u[IU]/mL (ref 0.450–4.500)

## 2018-03-14 LAB — C3 COMPLEMENT: COMPLEMENT C3, SERUM: 131 mg/dL (ref 82–167)

## 2018-03-14 LAB — C-REACTIVE PROTEIN: CRP: 2 mg/L (ref 0–10)

## 2018-03-27 ENCOUNTER — Encounter: Payer: Self-pay | Admitting: Allergy and Immunology

## 2018-03-27 ENCOUNTER — Ambulatory Visit: Payer: 59 | Admitting: Allergy and Immunology

## 2018-03-27 VITALS — BP 112/76 | HR 80 | Resp 16

## 2018-03-27 DIAGNOSIS — L501 Idiopathic urticaria: Secondary | ICD-10-CM | POA: Diagnosis not present

## 2018-03-27 DIAGNOSIS — K219 Gastro-esophageal reflux disease without esophagitis: Secondary | ICD-10-CM | POA: Diagnosis not present

## 2018-03-27 DIAGNOSIS — T7800XD Anaphylactic reaction due to unspecified food, subsequent encounter: Secondary | ICD-10-CM

## 2018-03-27 DIAGNOSIS — J4520 Mild intermittent asthma, uncomplicated: Secondary | ICD-10-CM | POA: Diagnosis not present

## 2018-03-27 DIAGNOSIS — J3089 Other allergic rhinitis: Secondary | ICD-10-CM

## 2018-03-27 NOTE — Patient Instructions (Addendum)
  1.  Continue to Treat immunological overactivity:   A.  Cetirizine 10 mg tablet twice a day  B.  Ranitidine 150 mg tablet twice a day  C.  Montelukast 10 mg tablet once a day  2.  Continue to Treat reflux:   A.  Consolidate caffeine to the lowest possible amount  B.  Dexilant 60 mg in a.m.  C.  Can continue OTC Maalox  3. If needed:   A.  Auvi-Q 0.3, Benadryl, MD/ER evaluation for allergic reaction  B.  Benadryl for itchiness  C.  Ventolin HFA  4. Obtain fall flu vaccine  5.  Return to clinic in 9 weeks or earlier if problem  6.  Follow through with GI evaluation for upper endoscopy

## 2018-03-27 NOTE — Progress Notes (Signed)
Follow-up Note  Referring Provider: Jearld Fenton, NP Primary Provider: Jearld Fenton, NP Date of Office Visit: 03/27/2018  Subjective:   Lorraine Wu (DOB: 1976-02-28) is a 42 y.o. female who returns to the [facility] on 03/27/2018 in re-evaluation of the following:  HPI: Laramie returns to this clinic in reevaluation of her urticaria with possible inflammatory component, pruritic disorder, mild intermittent asthma, perennial allergic rhinitis, reflux, and a history of anaphylactic shock due to shellfish.  I last saw her in his clinic on 07 March 2018 in evaluation of her urticaria and pruritus.  She is significantly improved on her current therapy and now just has some intermittent itchy spells and no hives.    Her reflux is improved with some attention to caffeine consumption and utilizing Dexilant on a consistent basis.  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  Allergies as of 03/27/2018      Reactions   Shellfish Allergy Hives, Shortness Of Breath, Nausea And Vomiting, Swelling   Throat itching   Penicillins Hives, Swelling      Medication List      AUVI-Q 0.3 mg/0.3 mL Soaj injection Generic drug:  EPINEPHrine Use as directed for life-threatening allergic reaction.   CALCIUM PO Take 2,000 Units by mouth daily.   cetirizine 10 MG tablet Commonly known as:  ZYRTEC Take 10 mg by mouth daily.   CREON 12000 units Cpep capsule Generic drug:  lipase/protease/amylase TAKE 2 CAPSULE(S) 3 TIMES A DAY BY ORAL ROUTE BEFORE MEALS FOR 30 DAYS.   dexlansoprazole 60 MG capsule Commonly known as:  DEXILANT Take 1 capsule (60 mg total) by mouth every morning.   eletriptan 20 MG  tablet Commonly known as:  RELPAX Take 20 mg by mouth daily as needed.   hydrOXYzine 25 MG tablet Commonly known as:  ATARAX/VISTARIL Take 25 mg by mouth 3 (three) times daily.   montelukast 10 MG tablet Commonly known as:  SINGULAIR Take 1 tablet (10 mg total) by mouth daily.   progesterone 100 MG capsule Commonly known as:  PROMETRIUM TAKE TWO CAPSULE BY MOUTH EVERY AT DINNER   ranitidine 150 MG tablet Commonly known as:  ZANTAC Take 1 tablet (150 mg total) by mouth 2 (two) times daily.   TESTOSTERONE COMPOUNDING KIT TD testosterone 20 mg ml  APPLY 4 CLICKS TO SKIN DAILY IN THE MORNING  STORE AT ROOM TEMPERATURE; PROTECT FROM LIGHT  DISCARD AFTER 06 01 19   topiramate 100 MG tablet Commonly known as:  TOPAMAX Take 100 mg by mouth every evening.   traMADol 50 MG tablet Commonly known as:  ULTRAM tramadol 50 mg tablet every 6 hours prn   triamcinolone cream 0.1 % Commonly known as:  KENALOG APPLY THIN COAT TO AFFECTED AREA TWICE A DAY   VENTOLIN HFA IN Inhale 1 puff into the lungs as needed.       Past Medical History:  Diagnosis Date  . Asthma   . Heart murmur   . History of insomnia   . IBS (irritable bowel syndrome)   . Migraine   . Urticaria     Past Surgical History:  Procedure Laterality Date  . ABDOMINAL HYSTERECTOMY  2011   total hysterectomy  .  CYSTECTOMY     02/2013 hand, eyebrow and head 05/2013  . WISDOM TOOTH EXTRACTION     x4    Review of systems negative except as noted in HPI / PMHx or noted below:  Review of Systems  Constitutional: Negative.   HENT: Negative.   Eyes: Negative.   Respiratory: Negative.   Cardiovascular: Negative.   Gastrointestinal: Negative.   Genitourinary: Negative.   Musculoskeletal: Negative.   Skin: Negative.   Neurological: Negative.   Endo/Heme/Allergies: Negative.   Psychiatric/Behavioral: Negative.      Objective:   Vitals:   03/27/18 1630  BP: 112/76  Pulse: 80  Resp: 16           Physical Exam  HENT:  Head: Normocephalic.  Right Ear: Tympanic membrane, external ear and ear canal normal.  Left Ear: Tympanic membrane, external ear and ear canal normal.  Nose: Nose normal. No mucosal edema or rhinorrhea.  Mouth/Throat: Uvula is midline, oropharynx is clear and moist and mucous membranes are normal. No oropharyngeal exudate.  Eyes: Conjunctivae are normal.  Neck: Trachea normal. No tracheal tenderness present. No tracheal deviation present. No thyromegaly present.  Cardiovascular: Normal rate, regular rhythm, S1 normal, S2 normal and normal heart sounds.  No murmur heard. Pulmonary/Chest: Breath sounds normal. No stridor. No respiratory distress. She has no wheezes. She has no rales.  Musculoskeletal: She exhibits no edema.  Lymphadenopathy:       Head (right side): No tonsillar adenopathy present.       Head (left side): No tonsillar adenopathy present.    She has no cervical adenopathy.  Neurological: She is alert.  Skin: No rash noted. She is not diaphoretic. No erythema. Nails show no clubbing.    Diagnostics:   Results of blood tests obtained 07 March 2018 identified creatinine 0.86 mg/DL, normal AST, normal ALT, WBC 7.3, absolute eosinophils 0, absolute lymphocyte 1600, hemoglobin 14.1, platelet 221, TSH 1.560 IU/mL, free T4 0.81 NG/DL, thyroid peroxidase antibody less than 6 IU/mL, C3 131 mg/DL, C4 23 mg/DL, C1q 11.8 mg/DL  Assessment and Plan:   1. Idiopathic urticaria   2. Asthma, mild intermittent, well-controlled   3. Perennial allergic rhinitis   4. 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  Continue to Treat immunological overactivity:   A.  Cetirizine 10 mg tablet twice a day  B.  Ranitidine 150 mg tablet twice a day  C.  Montelukast 10 mg tablet once a day  2.  Continue to Treat reflux:   A.  Consolidate caffeine to the lowest possible  amount  B.  Dexilant 60 mg in a.m.  C.  Can continue OTC Maalox  3. If needed:   A.  Auvi-Q 0.3, Benadryl, MD/ER evaluation for allergic reaction  B.  Benadryl for itchiness  C.  Ventolin HFA  4. Obtain fall flu vaccine  5.  Return to clinic in 9 weeks or earlier if problem  6.  Follow through with GI evaluation for upper endoscopy  Walburga is doing much better at this point in time and hopefully the further she gets away from the use of her progesterone and testosterone everything will come under good control and there will not be a requirement for utilizing medications directed against immunological hyperreactivity.  I would like to see her back in this clinic after 12 weeks of therapy.  She has already utilized this treatment for 3 weeks and thus  I will see her back in this clinic in 9 weeks.  Allena Katz, MD Allergy / Immunology [place]

## 2018-03-28 ENCOUNTER — Encounter: Payer: Self-pay | Admitting: Allergy and Immunology

## 2018-04-02 ENCOUNTER — Encounter: Payer: Self-pay | Admitting: Gastroenterology

## 2018-04-02 ENCOUNTER — Ambulatory Visit: Payer: 59 | Admitting: Gastroenterology

## 2018-04-02 VITALS — BP 104/62 | HR 76 | Ht 60.5 in | Wt 148.0 lb

## 2018-04-02 DIAGNOSIS — R1310 Dysphagia, unspecified: Secondary | ICD-10-CM | POA: Diagnosis not present

## 2018-04-02 DIAGNOSIS — R110 Nausea: Secondary | ICD-10-CM

## 2018-04-02 DIAGNOSIS — K219 Gastro-esophageal reflux disease without esophagitis: Secondary | ICD-10-CM | POA: Diagnosis not present

## 2018-04-02 NOTE — Patient Instructions (Signed)
Dexilant 60 mg in the evenings before dinner.   You have been scheduled for an endoscopy. Please follow written instructions given to you at your visit today. If you use inhalers (even only as needed), please bring them with you on the day of your procedure. Your physician has requested that you go to www.startemmi.com and enter the access code given to you at your visit today. This web site gives a general overview about your procedure. However, you should still follow specific instructions given to you by our office regarding your preparation for the procedure.

## 2018-04-02 NOTE — Progress Notes (Signed)
04/02/2018 Lorraine Wu [id number] April 26, 1976   HISTORY OF PRESENT ILLNESS: This is a 42 year-old female who is new to our office.  She has a previous GI history with Dr. Penelope Coop at [facility] for lower GI complaints and apparently was diagnosed with irritable bowel syndrome.  Had colonoscopy.  Is on Creon.  We will try to obtain those records.  Anyway, she is here today at the request of her asthma and allergy doctor, Dr. Neldon Mc, for evaluation regarding issues with acid reflux.  She tells me that she has had these issues for as long as she can remember even dating back to when she was a child.  She says that she wakes up with nausea every morning, and even with acid reflux treatment sometimes throws up in the middle the night.  She says that if she eats or drinks anything in the morning or even takes any type of medication that she will get sick and vomit.  She's required a lot of dental work over the years and her dentist thinks that severe acid reflux may be an underlying cause of these issues with her teeth.  Currently she is on Dexilant 60 mg daily and Zantac 150 mg twice daily.  She also reports difficulty swallowing her food at times and says that even when she is not eating she feels like there is something in her esophagus as if it is very thick and swollen.   Past Medical History:  Diagnosis Date  . Allergy   . Asthma   . GERD (gastroesophageal reflux disease)   . Heart murmur   . History of insomnia   . IBS (irritable bowel syndrome)   . Migraine   . Urticaria    Past Surgical History:  Procedure Laterality Date  . ABDOMINAL HYSTERECTOMY  2011  . CYSTECTOMY Right    02/2013 hand, eyebrow and head 05/2013  . WISDOM TOOTH EXTRACTION     x4    reports that she has never smoked. She has never used smokeless tobacco. She reports that she drinks alcohol. She reports that she does not use drugs. family history includes Anxiety disorder in her mother; Asthma in her sister  and son; Bipolar disorder in her mother; Cancer in her mother; Depression in her mother and sister; Diabetes in her mother and sister; Heart attack in her father; Heart disease in her father; Hyperlipidemia in her father; Hypertension in her father, mother, and sister; Melanoma in her mother; Rheum arthritis in her mother; Thyroid nodules in her sister. Allergies  Allergen Reactions  . Shellfish Allergy Hives, Shortness Of Breath, Nausea And Vomiting and Swelling    Throat itching  . Penicillins Hives and Swelling      Outpatient Encounter Medications as of 04/02/2018  Medication Sig  . Albuterol Sulfate (VENTOLIN HFA IN) Inhale 1 puff into the lungs as needed.  Marland Kitchen AUVI-Q 0.3 MG/0.3ML SOAJ injection Use as directed for life-threatening allergic reaction.  Marland Kitchen CALCIUM PO Take 2,000 Units by mouth daily.  . cetirizine (ZYRTEC) 10 MG tablet Take 10 mg by mouth daily.  Marland Kitchen CREON 12000 units CPEP capsule TAKE 2 CAPSULE(S) 3 TIMES A DAY BY ORAL ROUTE BEFORE MEALS FOR 30 DAYS.  Marland Kitchen dexlansoprazole (DEXILANT) 60 MG capsule Take 1 capsule (60 mg total) by mouth every morning.  . eletriptan (RELPAX) 20 MG tablet Take 20 mg by mouth daily as needed.  . hydrOXYzine (ATARAX/VISTARIL) 25 MG tablet Take 25 mg by mouth 3 (three) times daily.   Marland Kitchen  montelukast (SINGULAIR) 10 MG tablet Take 1 tablet (10 mg total) by mouth daily.  . ranitidine (ZANTAC) 150 MG tablet Take 1 tablet (150 mg total) by mouth 2 (two) times daily.  Marland Kitchen topiramate (TOPAMAX) 100 MG tablet Take 100 mg by mouth every evening.   . traMADol (ULTRAM) 50 MG tablet Take 50 mg by mouth as needed.   . triamcinolone cream (KENALOG) 0.1 % APPLY THIN COAT TO AFFECTED AREA TWICE A DAY  . [DISCONTINUED] progesterone (PROMETRIUM) 100 MG capsule TAKE TWO CAPSULE BY MOUTH EVERY AT DINNER  . [DISCONTINUED] TESTOSTERONE COMPOUNDING KIT TD testosterone 20 mg ml  APPLY 4 CLICKS TO SKIN DAILY IN THE MORNING  STORE AT ROOM TEMPERATURE; PROTECT FROM LIGHT  DISCARD AFTER  06 01 19   No facility-administered encounter medications on file as of 04/02/2018.      REVIEW OF SYSTEMS  : All other systems reviewed and negative except where noted in the History of Present Illness.   PHYSICAL EXAM: Ht 5' 0.5" (1.537 m) Comment: height measured without shoes  Wt 148 lb (67.1 kg)   BMI 28.43 kg/m  General: Well developed white female in no acute distress Head: Normocephalic and atraumatic Eyes:  Sclerae anicteric, conjunctiva pink. Ears: Normal auditory acuity Lungs: Clear throughout to auscultation; no increased WOB. Heart: Regular rate and rhythm; no M/R/G. Abdomen: Soft, non-distended.  BS present.  Mild epigastric TTP. Musculoskeletal: Symmetrical with no gross deformities  Skin: No lesions on visible extremities Extremities: No edema  Neurological: Alert oriented x 4, grossly non-focal. Psychological:  Alert and cooperative. Normal mood and affect  ASSESSMENT AND PLAN: *42 year old female with complaints of GERD, dysphagia, and morning nausea for years, almost life-long.  Her dentist thinks that GERD has caused a lot of her dental issues.  Will schedule for EGD for further evaluation.  Will begin taking her Dexilant in the evening at dinnertime.  Will continue zantac in the morning and at bedtime.  **The risks, benefits, and alternatives to EGD were discussed with the patient and she consents to proceed.   **Will get records from [facility].   CC:  Phylliss Bob, MD

## 2018-04-03 NOTE — Progress Notes (Signed)
Reviewed and agree with documentation and assessment and plan. K. Veena Nandigam , MD   

## 2018-04-13 ENCOUNTER — Encounter: Payer: Self-pay | Admitting: Gastroenterology

## 2018-04-27 ENCOUNTER — Ambulatory Visit (AMBULATORY_SURGERY_CENTER): Payer: 59 | Admitting: Gastroenterology

## 2018-04-27 ENCOUNTER — Encounter: Payer: Self-pay | Admitting: Gastroenterology

## 2018-04-27 VITALS — BP 99/73 | HR 73 | Temp 98.0°F | Resp 17 | Ht 60.5 in | Wt 148.0 lb

## 2018-04-27 DIAGNOSIS — K219 Gastro-esophageal reflux disease without esophagitis: Secondary | ICD-10-CM | POA: Diagnosis not present

## 2018-04-27 DIAGNOSIS — R1310 Dysphagia, unspecified: Secondary | ICD-10-CM | POA: Diagnosis present

## 2018-04-27 MED ORDER — SODIUM CHLORIDE 0.9 % IV SOLN: 500.0000 mL | Freq: Once | INTRAVENOUS | Status: DC

## 2018-04-27 NOTE — Op Note (Signed)
[facility] Patient Name: Lorraine Wu Procedure Date: 04/27/2018 10:43 AM MRN: [id number] Endoscopist: Napoleon Form , MD Age: 42 Referring MD:  Date of Birth: 1975-09-29 Gender: Female Account #: [id number] Procedure:                Upper GI endoscopy Indications:              Dysphagia, Epigastric abdominal pain Medicines:                Monitored Anesthesia Care Procedure:                Pre-Anesthesia Assessment:                           - Prior to the procedure, a History and Physical                            was performed, and patient medications and                            allergies were reviewed. The patient's tolerance of                            previous anesthesia was also reviewed. The risks                            and benefits of the procedure and the sedation                            options and risks were discussed with the patient.                            All questions were answered, and informed consent                            was obtained. Prior Anticoagulants: The patient has                            taken no previous anticoagulant or antiplatelet                            agents. ASA Grade Assessment: II - A patient with                            mild systemic disease. After reviewing the risks                            and benefits, the patient was deemed in                            satisfactory condition to undergo the procedure.                           After obtaining informed consent, the endoscope was  passed under direct vision. Throughout the                            procedure, the patient's blood pressure, pulse, and                            oxygen saturations were monitored continuously. The                            Model GIF-HQ190 [id number]) scope was introduced                            through the mouth, and advanced to the second part                            of  duodenum. The upper GI endoscopy was                            accomplished without difficulty. The patient                            tolerated the procedure well. Scope In: Scope Out: Findings:                 No endoscopic abnormality was evident in the                            esophagus to explain the patient's complaint of                            dysphagia.                           A 4 cm hiatal hernia was present.                           The stomach was normal.                           The stomach was normal.                           The examined duodenum was normal. Complications:            No immediate complications. Estimated Blood Loss:     Estimated blood loss was minimal. Impression:               - No endoscopic esophageal abnormality to explain                            patient's dysphagia.                           - 4 cm hiatal hernia.                           - Normal stomach.                           -  Normal stomach.                           - Normal examined duodenum.                           - No specimens collected. Recommendation:           - Patient has a contact number available for                            emergencies. The signs and symptoms of potential                            delayed complications were discussed with the                            patient. Return to normal activities tomorrow.                            Written discharge instructions were provided to the                            patient.                           - Resume previous diet.                           - Continue present medications.                           - Use Dexilant (dexlansoprazole) 60 mg PO daily.                           - Gavison (OTC) 1 tablet after meals as needed                           - Follow an antireflux regimen. This includes:                           - Do not lie down for at least 3 to 4 hours after                             meals.                           - Raise the head of the bed 4 to 6 inches.                           - Decrease excess weight.                           - Avoid citrus juices and other acidic foods,  alcohol, chocolate, mints, coffee and other                            caffeinated beverages, carbonated beverages, fatty                            and fried foods.                           - Avoid tight-fitting clothing.                           - Avoid cigarettes and other tobacco products. Napoleon Form, MD 04/27/2018 11:00:54 AM This report has been signed electronically.

## 2018-04-27 NOTE — Progress Notes (Signed)
To PACU, VSS. Report to [email or web address] 

## 2018-04-27 NOTE — Patient Instructions (Signed)
YOU HAD AN ENDOSCOPIC PROCEDURE TODAY AT THE [facility]:   Refer to the procedure report that was given to you for any specific questions about what was found during the examination.  If the procedure report does not answer your questions, please call your gastroenterologist to clarify.  If you requested that your care partner not be given the details of your procedure findings, then the procedure report has been included in a sealed envelope for you to review at your convenience later.  YOU SHOULD EXPECT: Some feelings of bloating in the abdomen. Passage of more gas than usual.  Walking can help get rid of the air that was put into your GI tract during the procedure and reduce the bloating. If you had a lower endoscopy (such as a colonoscopy or flexible sigmoidoscopy) you may notice spotting of blood in your stool or on the toilet paper. If you underwent a bowel prep for your procedure, you may not have a normal bowel movement for a few days.  Please Note:  You might notice some irritation and congestion in your nose or some drainage.  This is from the oxygen used during your procedure.  There is no need for concern and it should clear up in a day or so.  SYMPTOMS TO REPORT IMMEDIATELY:   Following upper endoscopy (EGD)  Vomiting of blood or coffee ground material  New chest pain or pain under the shoulder blades  Painful or persistently difficult swallowing  New shortness of breath  Fever of 100F or higher  Black, tarry-looking stools  For urgent or emergent issues, a gastroenterologist can be reached at any hour by calling [phone].   DIET:  We do recommend a small meal at first, but then you may proceed to your regular diet.  Drink plenty of fluids but you should avoid alcoholic beverages for 24 hours.  ACTIVITY:  You should plan to take it easy for the rest of today and you should NOT DRIVE or use heavy machinery until tomorrow (because of the sedation medicines used  during the test).    FOLLOW UP: Our staff will call the number listed on your records the next business day following your procedure to check on you and address any questions or concerns that you may have regarding the information given to you following your procedure. If we do not reach you, we will leave a message.  However, if you are feeling well and you are not experiencing any problems, there is no need to return our call.  We will assume that you have returned to your regular daily activities without incident.  If any biopsies were taken you will be contacted by phone or by letter within the next 1-3 weeks.  Please call us at [phone] if you have not heard about the biopsies in 3 weeks.    SIGNATURES/CONFIDENTIALITY: You and/or your care partner have signed paperwork which will be entered into your electronic medical record.  These signatures attest to the fact that that the information above on your After Visit Summary has been reviewed and is understood.  Full responsibility of the confidentiality of this discharge information lies with you and/or your care-partner. 

## 2018-04-30 ENCOUNTER — Telehealth: Payer: Self-pay

## 2018-04-30 NOTE — Telephone Encounter (Signed)
  Follow up Call-  Call back number 04/27/2018  Post procedure Call Back phone  # [phone]  Permission to leave phone message Yes  Some recent data might be hidden     Patient questions:  Do you have a fever, pain , or abdominal swelling? No. Pain Score  0 *  Have you tolerated food without any problems? Yes.    Have you been able to return to your normal activities? Yes.    Do you have any questions about your discharge instructions: Diet   No. Medications  No. Follow up visit  No.  Do you have questions or concerns about your Care? No.  Actions: * If pain score is 4 or above: No action needed, pain <4.

## 2018-05-29 ENCOUNTER — Encounter: Payer: Self-pay | Admitting: Allergy and Immunology

## 2018-05-29 ENCOUNTER — Ambulatory Visit: Payer: 59 | Admitting: Allergy and Immunology

## 2018-05-29 VITALS — BP 100/80 | HR 78 | Resp 18

## 2018-05-29 DIAGNOSIS — K219 Gastro-esophageal reflux disease without esophagitis: Secondary | ICD-10-CM

## 2018-05-29 DIAGNOSIS — J3089 Other allergic rhinitis: Secondary | ICD-10-CM | POA: Diagnosis not present

## 2018-05-29 DIAGNOSIS — T7800XD Anaphylactic reaction due to unspecified food, subsequent encounter: Secondary | ICD-10-CM

## 2018-05-29 DIAGNOSIS — J4530 Mild persistent asthma, uncomplicated: Secondary | ICD-10-CM | POA: Diagnosis not present

## 2018-05-29 DIAGNOSIS — L501 Idiopathic urticaria: Secondary | ICD-10-CM | POA: Diagnosis not present

## 2018-05-29 NOTE — Progress Notes (Signed)
Follow-up Note  Referring Provider: Lorre Munroe, NP Primary Provider: Reeves Dam, MD Date of Office Visit: 05/29/2018  Subjective:   Lorraine Wu (DOB: Jan 18, 1976) is a 42 y.o. 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  She was last seen in this clinic 27 March 2018.  She believes that she has her skin condition under very good control while using a combination of H1 and H2 receptor blocker and a leukotriene modifier.  She will still get some occasional urticarial lesions if she exercises and gets very hot and these appear to resolve when she cools down.  Overall she is very pleased with the response she has received with her current plan.  She has had some issues in the past 10 days with some wheezing.  She has had to use her short acting bronchodilator prior to exercise this past 10 days and normally she can exercise without any difficulty.  She has not really had any unusual exposures to different environments or started any new medications that may account for this issue.  She has not had any associated systemic or constitutional symptoms suggesting she may have an infectious disease.  She did visit with gastroenterology and had an upper endoscopy which fortunately appears to be okay.  She is now taking her Dexilant in the evening instead of in the morning and she is very careful now about caffeine consumption and overall feels as though she is doing a lot better with her GI tract.  She remains away from consuming shellfish  Allergies as of 05/29/2018      Reactions   Shellfish Allergy Hives, Shortness Of Breath, Nausea And Vomiting, Swelling   Throat itching   Penicillins Hives, Swelling        Medication List      AUVI-Q 0.3 mg/0.3 mL Soaj injection Generic drug:  EPINEPHrine Use as directed for life-threatening allergic reaction.   CALCIUM PO Take 2,000 Units by mouth daily.   cetirizine 10 MG tablet Commonly known as:  ZYRTEC Take 10 mg by mouth daily.   CREON 12000 units Cpep capsule Generic drug:  lipase/protease/amylase TAKE 2 CAPSULE(S) 3 TIMES A DAY BY ORAL ROUTE BEFORE MEALS FOR 30 DAYS.   dexlansoprazole 60 MG capsule Commonly known as:  DEXILANT Take 1 capsule (60 mg total) by mouth every morning.   eletriptan 20 MG tablet Commonly known as:  RELPAX Take 20 mg by mouth daily as needed.   hydrOXYzine 25 MG tablet Commonly known as:  ATARAX/VISTARIL Take 25 mg by mouth 3 (three) times daily.   montelukast 10 MG tablet Commonly known as:  SINGULAIR Take 1 tablet (10 mg total) by mouth daily.   ranitidine 150 MG tablet Commonly known as:  ZANTAC Take 1 tablet (150 mg total) by mouth 2 (two) times daily.   topiramate 100 MG tablet Commonly known as:  TOPAMAX Take 100 mg by mouth every evening.   traMADol 50 MG tablet Commonly known as:  ULTRAM Take 50 mg by mouth as needed.   triamcinolone cream 0.1 % Commonly known as:  KENALOG APPLY THIN COAT TO AFFECTED AREA TWICE A DAY   VENTOLIN HFA IN Inhale 1 puff into the lungs as needed.       Past Medical  History:  Diagnosis Date  . Allergy   . Asthma   . GERD (gastroesophageal reflux disease)   . Heart murmur   . History of insomnia   . IBS (irritable bowel syndrome)   . Migraine   . Urticaria     Past Surgical History:  Procedure Laterality Date  . ABDOMINAL HYSTERECTOMY  2011  . CYSTECTOMY Right    02/2013 hand, eyebrow and head 05/2013  . WISDOM TOOTH EXTRACTION     x4    Review of systems negative except as noted in HPI / PMHx or noted below:  Review of Systems  Constitutional: Negative.   HENT: Negative.   Eyes: Negative.   Respiratory: Negative.   Cardiovascular:  Negative.   Gastrointestinal: Negative.   Genitourinary: Negative.   Musculoskeletal: Negative.   Skin: Negative.   Neurological: Negative.   Endo/Heme/Allergies: Negative.   Psychiatric/Behavioral: Negative.      Objective:   Vitals:   05/29/18 1742  BP: 100/80  Pulse: 78  Resp: 18  SpO2: 95%          Physical Exam  HENT:  Head: Normocephalic.  Right Ear: Tympanic membrane, external ear and ear canal normal.  Left Ear: Tympanic membrane, external ear and ear canal normal.  Nose: Nose normal. No mucosal edema or rhinorrhea.  Mouth/Throat: Uvula is midline, oropharynx is clear and moist and mucous membranes are normal. No oropharyngeal exudate.  Eyes: Conjunctivae are normal.  Neck: Trachea normal. No tracheal tenderness present. No tracheal deviation present. No thyromegaly present.  Cardiovascular: Normal rate, regular rhythm, S1 normal, S2 normal and normal heart sounds.  No murmur heard. Pulmonary/Chest: Breath sounds normal. No stridor. No respiratory distress. She has no wheezes. She has no rales.  Musculoskeletal: She exhibits no edema.  Lymphadenopathy:       Head (right side): No tonsillar adenopathy present.       Head (left side): No tonsillar adenopathy present.    She has no cervical adenopathy.  Neurological: She is alert.  Skin: No rash noted. She is not diaphoretic. No erythema. Nails show no clubbing.    Diagnostics:    Spirometry was performed and demonstrated an FEV1 of 2.44 at 96 % of predicted.  The patient had an Asthma Control Test with the following results: ACT Total Score: 16.    Assessment and Plan:   1. Idiopathic urticaria   2. Perennial allergic rhinitis   3. 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 Gastroesophageal reflux disease, esophagitis presence not specified     1.  Continue to Treat immunological overactivity:   A.  Cetirizine 10 mg tablet twice a  day  B.  Ranitidine 150 mg tablet twice a day  C.  Montelukast 10 mg tablet once a day  2.  Continue to Treat reflux:   A.  Consolidate caffeine to the lowest possible amount  B.  Dexilant 60 mg in a.m.  3.  Treat inflammation of lower respiratory tract:   A.  Sample of Qvar 80 Redihaler -2 inhalations twice a day for 2 weeks  4. If needed:   A.  Auvi-Q 0.3, Benadryl, MD/ER evaluation for allergic reaction  B.  Benadryl    C.  Ventolin HFA  5. Return to clinic in February 2020 or earlier if problem  Kali appears to be doing relatively well on her current plan directed against her urticaria and immunological overactivity and her reflux.  She will continue on the plan noted above.  Her asthma appears to be a little more active for some reason which is not entirely clear.  I given her a sample of Qvar to use for the next 2 weeks or so and hopefully this will reset her lungs and she will not require any additional long-term controller agent.  She will keep in contact with me noting her response to this approach.  If she does well I will see her back in this clinic in February 2020 or earlier if there is a problem.  [name], MD Allergy / Immunology [facility]

## 2018-05-29 NOTE — Patient Instructions (Addendum)
  1.  Continue to Treat immunological overactivity:   A.  Cetirizine 10 mg tablet twice a day  B.  Ranitidine 150 mg tablet twice a day  C.  Montelukast 10 mg tablet once a day  2.  Continue to Treat reflux:   A.  Consolidate caffeine to the lowest possible amount  B.  Dexilant 60 mg in a.m.  3.  Treat inflammation of lower respiratory tract:   A.  Sample of Qvar 80 Redihaler -2 inhalations twice a day for 2 weeks  4. If needed:   A.  Auvi-Q 0.3, Benadryl, MD/ER evaluation for allergic reaction  B.  Benadryl    C.  Ventolin HFA  5. Return to clinic in February 2020 or earlier if problem

## 2018-05-30 ENCOUNTER — Encounter: Payer: Self-pay | Admitting: Allergy and Immunology

## 2018-05-30 DIAGNOSIS — L501 Idiopathic urticaria: Secondary | ICD-10-CM | POA: Insufficient documentation

## 2018-06-01 DIAGNOSIS — E038 Other specified hypothyroidism: Secondary | ICD-10-CM | POA: Insufficient documentation

## 2018-09-08 ENCOUNTER — Other Ambulatory Visit: Payer: Self-pay | Admitting: Allergy and Immunology

## 2018-09-09 ENCOUNTER — Other Ambulatory Visit: Payer: Self-pay | Admitting: Allergy and Immunology

## 2018-09-10 NOTE — Telephone Encounter (Signed)
Patient needs office visit.  

## 2018-09-18 ENCOUNTER — Encounter: Payer: Self-pay | Admitting: Allergy and Immunology

## 2018-09-18 ENCOUNTER — Ambulatory Visit: Payer: 59 | Admitting: Allergy and Immunology

## 2018-09-18 VITALS — BP 104/66 | HR 96 | Resp 16 | Ht 60.0 in | Wt 153.8 lb

## 2018-09-18 DIAGNOSIS — J3089 Other allergic rhinitis: Secondary | ICD-10-CM

## 2018-09-18 DIAGNOSIS — L501 Idiopathic urticaria: Secondary | ICD-10-CM | POA: Diagnosis not present

## 2018-09-18 DIAGNOSIS — K219 Gastro-esophageal reflux disease without esophagitis: Secondary | ICD-10-CM

## 2018-09-18 DIAGNOSIS — T7800XD Anaphylactic reaction due to unspecified food, subsequent encounter: Secondary | ICD-10-CM

## 2018-09-18 DIAGNOSIS — J4520 Mild intermittent asthma, uncomplicated: Secondary | ICD-10-CM | POA: Diagnosis not present

## 2018-09-18 MED ORDER — FEXOFENADINE HCL 180 MG PO TABS: 180.0000 mg | ORAL_TABLET | Freq: Every day | ORAL | 5 refills | Status: AC

## 2018-09-18 NOTE — Progress Notes (Signed)
Follow-up Note  Referring Provider: Reeves Dam, MD Primary Provider: Reeves Dam, MD Date of Office Visit: 09/18/2018  Subjective:   Lorraine Wu (DOB: [birth year]) is a 43 y.o. female who [facility] in re-evaluation of the following:  HPI: Lorraine Wu returns to this clinic in evaluation of her chronic urticaria and mild intermittent asthma and allergic rhinitis and history of food allergy directed against shellfish.  I last saw her in this clinic on 29 May 2018.  Once again she has excellent control of her urticaria on a collection of medications including cetirizine at 20 mg daily and ranitidine at 300 mg daily and montelukast 10 mg daily.  She sometimes gets a random urticarial lesion that will last less than a day and never heal with scar hyperpigmentation.  She has had very little issues with her asthma and rarely uses a short acting bronchodilator and can exercise without any difficulty.  Her reflux is under excellent control at this point in time.  She is down to drinking about a half a cup of coffee a day.  She continues on a proton pump inhibitor.  She remains away from consuming shellfish.  She did obtain a flu vaccine this year.  She relates a history of being tired through the daytime.  She sleeps really good at night when and she wakes up in the morning she feels pretty good but she is just fatigued throughout the day.  She has had evaluation for this issue and found to be low on her thyroid levels and was started on thyroid supplementation.  Apparently she also has a low testosterone level.  Allergies as of 09/18/2018      Reactions   Shellfish Allergy Hives, Shortness Of Breath, Nausea And Vomiting, Swelling   Throat itching   Penicillins Hives, Swelling      Medication List      AUVI-Q 0.3 mg/0.3 mL Soaj injection Generic drug:  EPINEPHrine Use as directed for life-threatening allergic reaction.   CALCIUM PO Take 2,000 Units by mouth daily.   cetirizine 10 MG tablet Commonly known as:  ZYRTEC Take 10 mg by mouth daily.   CREON 12000 units Cpep capsule Generic drug:  lipase/protease/amylase TAKE 2 CAPSULE(S) 3 TIMES A DAY BY ORAL ROUTE BEFORE MEALS FOR 30 DAYS.   DEXILANT 60 MG capsule Generic drug:  dexlansoprazole TAKE 1 CAPSULE BY MOUTH EVERY MORNING   eletriptan 20 MG tablet Commonly known as:  RELPAX Take 20 mg by mouth daily as needed.   montelukast 10 MG tablet Commonly known as:  SINGULAIR TAKE 1 TABLET BY MOUTH EVERY DAY   ranitidine 150 MG tablet Commonly known as:  ZANTAC TAKE 1 TABLET BY MOUTH TWICE A DAY   topiramate 100 MG tablet Commonly known as:  TOPAMAX Take 100 mg by mouth every evening.   traMADol 50 MG tablet Commonly known as:  ULTRAM Take 50 mg by mouth as needed.   triamcinolone cream 0.1 % Commonly known as:  KENALOG APPLY THIN COAT TO AFFECTED AREA TWICE A DAY   VENTOLIN HFA IN Inhale 1 puff into the lungs as needed.       Past Medical History:  Diagnosis Date  . Allergy   . Asthma   . GERD (gastroesophageal reflux disease)   . Heart murmur   . History of insomnia   . IBS (irritable bowel syndrome)   . Migraine   . Urticaria  Past Surgical History:  Procedure Laterality Date  . ABDOMINAL HYSTERECTOMY  2011  . CYSTECTOMY Right    02/2013 hand, eyebrow and head 05/2013  . WISDOM TOOTH EXTRACTION     x4    Review of systems negative except as noted in HPI / PMHx or noted below:  Review of Systems  Constitutional: Negative.   HENT: Negative.   Eyes: Negative.   Respiratory: Negative.   Cardiovascular: Negative.   Gastrointestinal: Negative.   Genitourinary: Negative.   Musculoskeletal: Negative.   Skin: Negative.   Neurological: Negative.   Endo/Heme/Allergies: Negative.   Psychiatric/Behavioral: Negative.      Objective:   Vitals:   09/18/18 1732  BP: 104/66  Pulse: 96  Resp: 16  SpO2: 96%    Height: 5' (152.4 cm)  Weight: 153 lb 12.8 oz (69.8 kg)   Physical Exam Constitutional:      Appearance: She is not diaphoretic.  HENT:     Head: Normocephalic.     Right Ear: Tympanic membrane, ear canal and external ear normal.     Left Ear: Tympanic membrane, ear canal and external ear normal.     Nose: Nose normal. No mucosal edema or rhinorrhea.     Mouth/Throat:     Pharynx: Uvula midline. No oropharyngeal exudate.  Eyes:     Conjunctiva/sclera: Conjunctivae normal.  Neck:     Thyroid: No thyromegaly.     Trachea: Trachea normal. No tracheal tenderness or tracheal deviation.  Cardiovascular:     Rate and Rhythm: Normal rate and regular rhythm.     Heart sounds: Normal heart sounds, S1 normal and S2 normal. No murmur.  Pulmonary:     Effort: No respiratory distress.     Breath sounds: Normal breath sounds. No stridor. No wheezing or rales.  Lymphadenopathy:     Head:     Right side of head: No tonsillar adenopathy.     Left side of head: No tonsillar adenopathy.     Cervical: No cervical adenopathy.  Skin:    Findings: No erythema or rash.     Nails: There is no clubbing.   Neurological:     Mental Status: She is alert.     Diagnostics:    Spirometry was performed and demonstrated an FEV1 of 2.33 at 88 % of predicted.  The patient had an Asthma Control Test with the following results: ACT Total Score: 25.    Assessment and Plan:   1. Idiopathic urticaria   2. Perennial allergic rhinitis   3. Asthma, mild intermittent, well-controlled   4. Anaphylactic shock due to food, subsequent encounter   5. Gastroesophageal reflux disease, esophagitis presence not specified      1.  Continue to Treat immunological overactivity:   A.  Fexofenadine 180 tablet twice a day (replaces cetirizine)  B.  Ranitidine 150 mg tablet twice a day  C.  Montelukast 10 mg tablet once a day  2.  Continue to Treat reflux:   A.  Consolidate caffeine to the lowest possible  amount  B.  Dexilant 60 mg in a.m.  3. If needed:   A.  Auvi-Q 0.3, Benadryl, MD/ER evaluation for allergic reaction  B.  Benadryl    C.  Ventolin HFA  4. Return to clinic in 6 months or earlier if problem   Ermagene appears to be doing quite well regarding her immunological hyperactivity and some of her other atopic disease and I will assume that she will continue to do well as  we have her use a collection of medical therapy directed against immunological hyperactivity and also continue to treat her reflux.  But, I am somewhat worried that her fatigue may be a reflection of using cetirizine twice a day and we will switch this over to fexofenadine.  She will need to keep [place] informed about whether or not this does result in resolution of this fatigue and whether or not she can maintain good control of her urticaria while changing her medications.  Assuming she does well I will see her back in this clinic in 6 months or earlier if there is a problem.  [name], MD Allergy / Immunology [facility]

## 2018-09-18 NOTE — Patient Instructions (Addendum)
  1.  Continue to Treat immunological overactivity:   A.  Fexofenadine 180 tablet twice a day (replaces cetirizine)  B.  Ranitidine 150 mg tablet twice a day  C.  Montelukast 10 mg tablet once a day  2.  Continue to Treat reflux:   A.  Consolidate caffeine to the lowest possible amount  B.  Dexilant 60 mg in a.m.  3. If needed:   A.  Auvi-Q 0.3, Benadryl, MD/ER evaluation for allergic reaction  B.  Benadryl    C.  Ventolin HFA  4. Return to clinic in 6 months or earlier if problem

## 2018-09-19 ENCOUNTER — Encounter: Payer: Self-pay | Admitting: Allergy and Immunology

## 2018-10-08 ENCOUNTER — Other Ambulatory Visit: Payer: Self-pay | Admitting: Allergy and Immunology

## 2018-10-24 ENCOUNTER — Other Ambulatory Visit: Payer: Self-pay | Admitting: Allergy and Immunology

## 2018-11-15 DIAGNOSIS — M7741 Metatarsalgia, right foot: Secondary | ICD-10-CM | POA: Insufficient documentation

## 2019-03-19 ENCOUNTER — Ambulatory Visit: Payer: 59 | Admitting: Allergy and Immunology

## 2019-03-19 ENCOUNTER — Ambulatory Visit (INDEPENDENT_AMBULATORY_CARE_PROVIDER_SITE_OTHER): Payer: 59 | Admitting: Allergy and Immunology

## 2019-03-19 ENCOUNTER — Other Ambulatory Visit: Payer: Self-pay

## 2019-03-19 ENCOUNTER — Encounter: Payer: Self-pay | Admitting: Allergy and Immunology

## 2019-03-19 VITALS — BP 102/74 | HR 128 | Temp 98.7°F | Resp 16 | Ht 60.0 in

## 2019-03-19 DIAGNOSIS — J4520 Mild intermittent asthma, uncomplicated: Secondary | ICD-10-CM | POA: Diagnosis not present

## 2019-03-19 DIAGNOSIS — T7800XD Anaphylactic reaction due to unspecified food, subsequent encounter: Secondary | ICD-10-CM

## 2019-03-19 DIAGNOSIS — K219 Gastro-esophageal reflux disease without esophagitis: Secondary | ICD-10-CM

## 2019-03-19 DIAGNOSIS — J3089 Other allergic rhinitis: Secondary | ICD-10-CM

## 2019-03-19 DIAGNOSIS — L501 Idiopathic urticaria: Secondary | ICD-10-CM | POA: Diagnosis not present

## 2019-03-19 NOTE — Progress Notes (Signed)
- [place] - [place]   Follow-up Note  Referring Provider: Phylliss Bob, MD Primary Provider: Phylliss Bob, MD Date of Office Visit: 03/19/2019  Subjective:   Lorraine Wu (DOB: Oct 28, 1975) is a 43 y.o. female who [facility] to [facility] on 03/19/2019 in re-evaluation of the following:  HPI: Lorraine Wu returns to this clinic in evaluation of urticaria and distant history of asthma and reflux and food allergy directed against shellfish.  I have not seen her in this clinic since 18 September 2018.  She is doing very well with her skin.  She has eliminated the use of cetirizine and indeed this appeared to be responsible for her daytime sleepiness.  She is now using Allegra just 1 time per day as well as montelukast and continues on ranitidine.  She was given [place] by her primary care doctor for sun exposure induced inflammation which appears to be working quite well.  Her use of Lorraine Wu is a few times a month.  She has had no issues with asthma and does not need to use a short acting bronchodilator.  She has had no issues with her nose.  Her reflux is under excellent control at this point in time on Dexilant.  She still drinks about a half a cup of coffee a day.  She does not consume shellfish.  Allergies as of 03/19/2019      Reactions   Shellfish Allergy Hives, Shortness Of Breath, Nausea And Vomiting, Swelling   Throat itching   Penicillins Hives, Swelling      Medication List      Auvi-Q 0.3 mg/0.3 mL Soaj injection Generic drug: EPINEPHrine Use as directed for life-threatening allergic reaction.   CALCIUM PO Take 2,000 Units by mouth daily.   Creon 12000 units Cpep capsule Generic drug: lipase/protease/amylase TAKE 2 CAPSULE(S) 3 TIMES A DAY BY ORAL ROUTE BEFORE MEALS FOR 30 DAYS.   Dexilant 60 MG capsule Generic drug: dexlansoprazole TAKE 1 CAPSULE BY MOUTH EVERY MORNING   eletriptan 20 MG tablet  Commonly known as: RELPAX Take 20 mg by mouth daily as needed.   fexofenadine 180 MG tablet Commonly known as: ALLEGRA Take 1 tablet (180 mg total) by mouth daily.   hydrOXYzine 25 MG tablet Commonly known as: ATARAX/VISTARIL Take 25 mg by mouth 3 (three) times daily.   montelukast 10 MG tablet Commonly known as: SINGULAIR TAKE 1 TABLET BY MOUTH EVERY DAY   ranitidine 150 MG tablet Commonly known as: ZANTAC TAKE 1 TABLET BY MOUTH TWICE A DAY   topiramate 100 MG tablet Commonly known as: TOPAMAX Take 100 mg by mouth every evening.   traMADol 50 MG tablet Commonly known as: ULTRAM Take 50 mg by mouth as needed.   triamcinolone cream 0.1 % Commonly known as: KENALOG APPLY THIN COAT TO AFFECTED AREA TWICE A DAY   VENTOLIN HFA IN Inhale 1 puff into the lungs as needed.       Past Medical History:  Diagnosis Date  . Allergy   . Asthma   . GERD (gastroesophageal reflux disease)   . Heart murmur   . History of insomnia   . IBS (irritable bowel syndrome)   . Migraine   . Urticaria     Past Surgical History:  Procedure Laterality Date  . ABDOMINAL HYSTERECTOMY  2011  . CYSTECTOMY Right    02/2013 hand, eyebrow and head 05/2013  . WISDOM TOOTH EXTRACTION     x4  Review of systems negative except as noted in HPI / PMHx or noted below:  Review of Systems  Constitutional: Negative.   HENT: Negative.   Eyes: Negative.   Respiratory: Negative.   Cardiovascular: Negative.   Gastrointestinal: Negative.   Genitourinary: Negative.   Musculoskeletal: Negative.   Skin: Negative.   Neurological: Negative.   Endo/Heme/Allergies: Negative.   Psychiatric/Behavioral: Negative.      Objective:   Vitals:   03/19/19 1636  BP: 102/74  Pulse: (!) 128  Resp: 16  Temp: 98.7 F (37.1 C)  SpO2: 97%   Height: 5' (152.4 cm)      Physical Exam Constitutional:      Appearance: She is not diaphoretic.  HENT:     Head: Normocephalic.     Right Ear: Tympanic  membrane, ear canal and external ear normal.     Left Ear: Tympanic membrane, ear canal and external ear normal.     Nose: Nose normal. No mucosal edema or rhinorrhea.     Mouth/Throat:     Pharynx: Uvula midline. No oropharyngeal exudate.  Eyes:     Conjunctiva/sclera: Conjunctivae normal.  Neck:     Thyroid: No thyromegaly.     Trachea: Trachea normal. No tracheal tenderness or tracheal deviation.  Cardiovascular:     Rate and Rhythm: Normal rate and regular rhythm.     Heart sounds: Normal heart sounds, S1 normal and S2 normal. No murmur.  Pulmonary:     Effort: No respiratory distress.     Breath sounds: Normal breath sounds. No stridor. No wheezing or rales.  Lymphadenopathy:     Head:     Right side of head: No tonsillar adenopathy.     Left side of head: No tonsillar adenopathy.     Cervical: No cervical adenopathy.  Skin:    Findings: No erythema or rash.     Nails: There is no clubbing.   Neurological:     Mental Status: She is alert.     Diagnostics:    Spirometry was performed and demonstrated an FEV1 of 2.30 at 89 % of predicted.  Assessment and Plan:   1. Idiopathic urticaria   2. Perennial allergic rhinitis   3. Asthma, mild intermittent, well-controlled   4. Anaphylactic shock due to food, subsequent encounter   5. Gastroesophageal reflux disease, esophagitis presence not specified     1.  Continue to Treat immunological overactivity:   A.  Fexofenadine 180 tablet once a day  B.  Montelukast 10 mg tablet once a day  C.  Discontinue ranitidine  2.  Continue to Treat reflux:   A.  Consolidate caffeine   B.  Dexilant 60 mg in a.m.  3. If needed:   A.  Auvi-Q 0.3, Benadryl, MD/ER evaluation for allergic reaction  B.  Benadryl    C.  Ventolin HFA  4. Return to clinic in 6 months or earlier if problem  5. Obtain fall flu vaccine (and COVID vaccine)  Story appears to be doing better and we will now see if we can consolidate some of her therapy  by eliminating ranitidine while she continues on a H1 receptor blocker and leukotriene modifier.  She will also continue to treat her reflux with Dexilant.  I will see her back in this clinic in 6 months or earlier if there is a problem.  Hopefully there will be an opportunity to further consolidate her treatment with that visit.  Lorraine SchimkeEric Kozlow, MD Allergy / Immunology [facility]

## 2019-03-19 NOTE — Patient Instructions (Addendum)
  1.  Continue to Treat immunological overactivity:   A.  Fexofenadine 180 tablet once a day  B.  Montelukast 10 mg tablet once a day  C.  Discontinue ranitidine  2.  Continue to Treat reflux:   A.  Consolidate caffeine   B.  Dexilant 60 mg in a.m.  3. If needed:   A.  Auvi-Q 0.3, Benadryl, MD/ER evaluation for allergic reaction  B.  Benadryl    C.  Ventolin HFA  4. Return to clinic in 6 months or earlier if problem  5. Obtain fall flu vaccine (and COVID vaccine)

## 2019-03-20 ENCOUNTER — Encounter: Payer: Self-pay | Admitting: Allergy and Immunology

## 2019-03-25 ENCOUNTER — Other Ambulatory Visit: Payer: Self-pay | Admitting: Allergy and Immunology

## 2019-09-17 ENCOUNTER — Ambulatory Visit: Payer: 59 | Admitting: Allergy and Immunology

## 2019-09-17 ENCOUNTER — Encounter: Payer: Self-pay | Admitting: Allergy and Immunology

## 2019-09-17 ENCOUNTER — Other Ambulatory Visit: Payer: Self-pay

## 2019-09-17 VITALS — BP 100/70 | HR 84 | Temp 97.7°F | Resp 16 | Ht 60.0 in | Wt 150.8 lb

## 2019-09-17 DIAGNOSIS — J3089 Other allergic rhinitis: Secondary | ICD-10-CM

## 2019-09-17 DIAGNOSIS — L501 Idiopathic urticaria: Secondary | ICD-10-CM

## 2019-09-17 DIAGNOSIS — R1030 Lower abdominal pain, unspecified: Secondary | ICD-10-CM | POA: Insufficient documentation

## 2019-09-17 DIAGNOSIS — E669 Obesity, unspecified: Secondary | ICD-10-CM | POA: Insufficient documentation

## 2019-09-17 DIAGNOSIS — E785 Hyperlipidemia, unspecified: Secondary | ICD-10-CM | POA: Insufficient documentation

## 2019-09-17 DIAGNOSIS — N951 Menopausal and female climacteric states: Secondary | ICD-10-CM | POA: Insufficient documentation

## 2019-09-17 DIAGNOSIS — T7800XD Anaphylactic reaction due to unspecified food, subsequent encounter: Secondary | ICD-10-CM

## 2019-09-17 DIAGNOSIS — J4520 Mild intermittent asthma, uncomplicated: Secondary | ICD-10-CM

## 2019-09-17 DIAGNOSIS — N83209 Unspecified ovarian cyst, unspecified side: Secondary | ICD-10-CM | POA: Insufficient documentation

## 2019-09-17 DIAGNOSIS — R5383 Other fatigue: Secondary | ICD-10-CM | POA: Insufficient documentation

## 2019-09-17 MED ORDER — AUVI-Q 0.3 MG/0.3ML IJ SOAJ: INTRAMUSCULAR | 2 refills | Status: DC

## 2019-09-17 NOTE — Patient Instructions (Addendum)
  1.  Continue to Treat immunological overactivity:   A.  Montelukast 10 mg tablet once a day  2.  Continue to Treat reflux:   A.  Consolidate caffeine   B.  Dexilant 60 mg in a.m.  3. If needed:   A.  Auvi-Q 0.3, Benadryl, MD/ER evaluation for allergic reaction  B.  Benadryl    C.  Albuterol HFA  D.  Anti-histamine  4. "Action Plan" for asthma flare up:   A. Sample Pulmicort 90 - 2 inhalations 2 times per day  B. Use Albuterol HFA if needed  5. Return to clinic in 12 months or earlier if problem

## 2019-09-17 NOTE — Progress Notes (Signed)
[place] - [place] - [place] - [place] - [place]   Follow-up Note  Referring Provider: Reeves Dam, MD Primary Provider: Reeves Dam, MD Date of Office Visit: 09/17/2019  Subjective:   Lorraine Wu (DOB: 27-Sep-1975) is a 44 y.o. female who [facility] 09[facility]2019 in re-evaluation of the following:  HPI: Lorraine Wu returns to this clinic in evaluation of her recurrent urticaria and occasional inflammatory dermatosis and distant history of asthma and allergic rhinitis and reflux and food allergy directed against shellfish.  Her last visit to this clinic was 19 March 2019.  Her skin has really been doing quite well and she does not need to utilize much antihistamine at all at this point in time.  There is an occasional requirement for some Eucrisa for what sounds like dry skin patches.  She has had very little issues with her airway.  She continues to use montelukast on a consistent basis.  She rarely uses any short acting bronchodilator.  She has not had to activate a "action plan" for an asthma flare.  There has been no requirement for systemic steroid or antibiotic for any type of airway issue.  Her reflux remains under excellent control while using Dexilant.  She did obtain the flu vaccine and has completed a series of Covid vaccination.  She remains away from consuming shellfish.  Allergies as of 09/17/2019      Reactions   Shellfish Allergy Hives, Shortness Of Breath, Nausea And Vomiting, Swelling   Throat itching   Penicillins Hives, Swelling      Medication List    Auvi-Q 0.3 mg/0.3 mL Soaj injection Generic drug: EPINEPHrine Use as directed for life-threatening allergic reaction.   CALCIUM PO Take 2,000 Units by mouth daily.   Creon 12000 units Cpep capsule Generic drug: lipase/protease/amylase TAKE 2 CAPSULE(S) 3 TIMES A DAY BY ORAL ROUTE BEFORE MEALS FOR 30 DAYS.   Dexilant 60 MG capsule Generic drug:  dexlansoprazole TAKE 1 CAPSULE BY MOUTH EVERY MORNING   eletriptan 20 MG tablet Commonly known as: RELPAX Take 20 mg by mouth daily as needed.   fexofenadine 180 MG tablet Commonly known as: ALLEGRA Take 1 tablet (180 mg total) by mouth daily.   hydrOXYzine 25 MG tablet Commonly known as: ATARAX/VISTARIL Take 25 mg by mouth 3 (three) times daily.   montelukast 10 MG tablet Commonly known as: SINGULAIR TAKE 1 TABLET BY MOUTH EVERY DAY   topiramate 100 MG tablet Commonly known as: TOPAMAX Take 100 mg by mouth every evening.   traMADol 50 MG tablet Commonly known as: ULTRAM Take 50 mg by mouth as needed.   VENTOLIN HFA IN Inhale 1 puff into the lungs as needed.       Past Medical History:  Diagnosis Date  . Allergy   . Asthma   . GERD (gastroesophageal reflux disease)   . Heart murmur   . History of insomnia   . IBS (irritable bowel syndrome)   . Migraine   . Urticaria     Past Surgical History:  Procedure Laterality Date  . ABDOMINAL HYSTERECTOMY  2011  . CYSTECTOMY Right    02/2013 hand, eyebrow and head 05/2013  . WISDOM TOOTH EXTRACTION     x4    Review of systems negative except as noted in HPI / PMHx or noted below:  Review of Systems  Constitutional: Negative.   HENT: Negative.   Eyes: Negative.   Respiratory: Negative.   Cardiovascular: Negative.  Gastrointestinal: Negative.   Genitourinary: Negative.   Musculoskeletal: Negative.   Skin: Negative.   Neurological: Negative.   Endo/Heme/Allergies: Negative.   Psychiatric/Behavioral: Negative.      Objective:   Vitals:   09/17/19 1633  BP: 100/70  Pulse: 84  Resp: 16  Temp: 97.7 F (36.5 C)  SpO2: 98%   Height: 5' (152.4 cm)  Weight: 150 lb 12.8 oz (68.4 kg)   Physical Exam Constitutional:      Appearance: She is not diaphoretic.  HENT:     Head: Normocephalic.     Right Ear: Tympanic membrane, ear canal and external ear normal.     Left Ear: Tympanic membrane, ear canal  and external ear normal.     Nose: Nose normal. No mucosal edema or rhinorrhea.     Mouth/Throat:     Pharynx: Uvula midline. No oropharyngeal exudate.  Eyes:     Conjunctiva/sclera: Conjunctivae normal.  Neck:     Thyroid: No thyromegaly.     Trachea: Trachea normal. No tracheal tenderness or tracheal deviation.  Cardiovascular:     Rate and Rhythm: Normal rate and regular rhythm.     Heart sounds: Normal heart sounds, S1 normal and S2 normal. No murmur.  Pulmonary:     Effort: No respiratory distress.     Breath sounds: Normal breath sounds. No stridor. No wheezing or rales.  Lymphadenopathy:     Head:     Right side of head: No tonsillar adenopathy.     Left side of head: No tonsillar adenopathy.     Cervical: No cervical adenopathy.  Skin:    Findings: No erythema or rash.     Nails: There is no clubbing.  Neurological:     Mental Status: She is alert.     Diagnostics:    Spirometry was performed and demonstrated an FEV1 of 2.50 at 97 % of predicted.  Assessment and Plan:   1. Idiopathic urticaria   2. Perennial allergic rhinitis   3. Asthma, mild intermittent, well-controlled   4. Anaphylactic shock due to food, subsequent encounter     1.  Continue to Treat immunological overactivity:   A.  Montelukast 10 mg tablet once a day  2.  Continue to Treat reflux:   A.  Consolidate caffeine   B.  Dexilant 60 mg in a.m.  3. If needed:   A.  Auvi-Q 0.3, Benadryl, MD/ER evaluation for allergic reaction  B.  Benadryl    C.  Albuterol HFA  D.  Anti-histamine  4. "Action Plan" for asthma flare up:   A. Sample Pulmicort 90 - 2 inhalations 2 times per day  B. Use Albuterol HFA if needed  5. Return to clinic in 12 months or earlier if problem   Lorraine Wu appears to really be doing much better at this point in time with very good control of her overactive immune system while using montelukast on a regular basis and very good control of her reflux with using Dexilant on  a regular basis.  We will continue to have her use this combination of therapy and of course other medication should they be required and I have given her an action plan to utilize should she develop an asthma flare in the future.  Assuming she continues to do well with this plan I will see her back in this clinic in 1 year or earlier if there is a problem.   Allena Katz, MD Allergy / Immunology [place]

## 2019-09-18 ENCOUNTER — Encounter: Payer: Self-pay | Admitting: Allergy and Immunology

## 2019-12-15 ENCOUNTER — Other Ambulatory Visit: Payer: Self-pay | Admitting: Allergy and Immunology

## 2020-12-24 ENCOUNTER — Ambulatory Visit: Payer: 59 | Admitting: Family Medicine

## 2020-12-24 ENCOUNTER — Encounter: Payer: Self-pay | Admitting: Family Medicine

## 2020-12-24 ENCOUNTER — Other Ambulatory Visit: Payer: Self-pay

## 2020-12-24 VITALS — BP 126/80 | HR 89 | Temp 98.7°F | Resp 18 | Ht 60.0 in | Wt 154.6 lb

## 2020-12-24 DIAGNOSIS — J4530 Mild persistent asthma, uncomplicated: Secondary | ICD-10-CM

## 2020-12-24 DIAGNOSIS — L501 Idiopathic urticaria: Secondary | ICD-10-CM

## 2020-12-24 DIAGNOSIS — J3089 Other allergic rhinitis: Secondary | ICD-10-CM | POA: Diagnosis not present

## 2020-12-24 DIAGNOSIS — J45909 Unspecified asthma, uncomplicated: Secondary | ICD-10-CM | POA: Insufficient documentation

## 2020-12-24 DIAGNOSIS — K219 Gastro-esophageal reflux disease without esophagitis: Secondary | ICD-10-CM

## 2020-12-24 DIAGNOSIS — T7800XD Anaphylactic reaction due to unspecified food, subsequent encounter: Secondary | ICD-10-CM

## 2020-12-24 DIAGNOSIS — J302 Other seasonal allergic rhinitis: Secondary | ICD-10-CM

## 2020-12-24 DIAGNOSIS — T7800XA Anaphylactic reaction due to unspecified food, initial encounter: Secondary | ICD-10-CM | POA: Insufficient documentation

## 2020-12-24 MED ORDER — AUVI-Q 0.3 MG/0.3ML IJ SOAJ: INTRAMUSCULAR | 2 refills | Status: DC

## 2020-12-24 NOTE — Patient Instructions (Signed)
Asthma Continue montelukast 10 mg once a day to prevent cough and wheeze Continue albuterol 2 puffs once every 4 hours as needed for cough or wheeze May use albuterol 2 puffs 5 to 15 minutes before activity to prevent cough or wheeze For asthma flare, begin ArmonAir 113-1 puff twice a day for 2 weeks or until cough and wheeze free  Allergic rhinitis Continue allergen avoidance measures directed toward grass pollen, tree pollen dog, cat, dust mite, and cockroach as listed below Continue Allegra 180 mg once a day as needed for a runny nose Consider saline nasal rinses as needed for nasal symptoms. Use this before any medicated nasal sprays for best result  Chronic urticaria Continue Allegra 180 mg once a day If your symptoms re-occur, begin a journal of events that occurred for up to 6 hours before your symptoms began including foods and beverages consumed, soaps or perfumes you had contact with, and medications.   Reflux Continue dietary lifestyle modifications as listed below Continue Dexilant 60 mg once a day to control reflux  Food allergy Continue to avoid shellfish. In case of an allergic reaction, take Benadryl 50 mg every 4 hours, and if life-threatening symptoms occur, inject with AuviQ 0.3 mg.  Call the clinic if this treatment plan is not working well for you  Follow up in 1 year or sooner if needed.  Reducing Pollen Exposure The American Academy of Allergy, Asthma and Immunology suggests the following steps to reduce your exposure to pollen during allergy seasons. 1. Do not hang sheets or clothing out to dry; pollen may collect on these items. 2. Do not mow lawns or spend time around freshly cut grass; mowing stirs up pollen. 3. Keep windows closed at night.  Keep car windows closed while driving. 4. Minimize morning activities outdoors, a time when pollen counts are usually at their highest. 5. Stay indoors as much as possible when pollen counts or humidity is high and on  windy days when pollen tends to remain in the air longer. 6. Use air conditioning when possible.  Many air conditioners have filters that trap the pollen spores. 7. Use a HEPA room air filter to remove pollen form the indoor air you breathe.  Control of Dog or Cat Allergen Avoidance is the best way to manage a dog or cat allergy. If you have a dog or cat and are allergic to dog or cats, consider removing the dog or cat from the home. If you have a dog or cat but don't want to find it a new home, or if your family wants a pet even though someone in the household is allergic, here are some strategies that may help keep symptoms at bay:  8. Keep the pet out of your bedroom and restrict it to only a few rooms. Be advised that keeping the dog or cat in only one room will not limit the allergens to that room. 9. Don't pet, hug or kiss the dog or cat; if you do, wash your hands with soap and water. 10. High-efficiency particulate air (HEPA) cleaners run continuously in a bedroom or living room can reduce allergen levels over time. 11. Regular use of a high-efficiency vacuum cleaner or a central vacuum can reduce allergen levels. 12. Giving your dog or cat a bath at least once a week can reduce airborne allergen.   Control of Dust Mite Allergen Dust mites play a major role in allergic asthma and rhinitis. They occur in environments with high humidity wherever  human skin is found. Dust mites absorb humidity from the atmosphere (ie, they do not drink) and feed on organic matter (including shed human and animal skin). Dust mites are a microscopic type of insect that you cannot see with the naked eye. High levels of dust mites have been detected from mattresses, pillows, carpets, upholstered furniture, bed covers, clothes, soft toys and any woven material. The principal allergen of the dust mite is found in its feces. A gram of dust may contain 1,000 mites and 250,000 fecal particles. Mite antigen is easily  measured in the air during house cleaning activities. Dust mites do not bite and do not cause harm to humans, other than by triggering allergies/asthma.  Ways to decrease your exposure to dust mites in your home:  1. Encase mattresses, box springs and pillows with a mite-impermeable barrier or cover  2. Wash sheets, blankets and drapes weekly in hot water (130 F) with detergent and dry them in a dryer on the hot setting.  3. Have the room cleaned frequently with a vacuum cleaner and a damp dust-mop. For carpeting or rugs, vacuuming with a vacuum cleaner equipped with a high-efficiency particulate air (HEPA) filter. The dust mite allergic individual should not be in a room which is being cleaned and should wait 1 hour after cleaning before going into the room.  4. Do not sleep on upholstered furniture (eg, couches).  5. If possible removing carpeting, upholstered furniture and drapery from the home is ideal. Horizontal blinds should be eliminated in the rooms where the person spends the most time (bedroom, study, television room). Washable vinyl, roller-type shades are optimal.  6. Remove all non-washable stuffed toys from the bedroom. Wash stuffed toys weekly like sheets and blankets above.  7. Reduce indoor humidity to less than 50%. Inexpensive humidity monitors can be purchased at most hardware stores. Do not use a humidifier as can make the problem worse and are not recommended.  Control of Cockroach Allergen Cockroach allergen has been identified as an important cause of acute attacks of asthma, especially in urban settings.  There are fifty-five species of cockroach that exist in the [place], however only three, the [place], [place] species produce allergen that can affect patients with Asthma.  Allergens can be obtained from fecal particles, egg casings and secretions from cockroaches.    1. Remove food sources. 2. Reduce access to water. 3. Seal access and entry  points. 4. Spray runways with 0.5-1% Diazinon or Chlorpyrifos 5. Blow boric acid power under stoves and refrigerator. 6. Place bait stations (hydramethylnon) at feeding sites.   Lifestyle Changes for Controlling GERD When you have GERD, stomach acid feels as if it's backing up toward your mouth. Whether or not you take medication to control your GERD, your symptoms can often be improved with lifestyle changes.   Raise Your Head  Reflux is more likely to strike when you're lying down flat, because stomach fluid can  flow backward more easily. Raising the head of your bed 4-6 inches can help. To do this:  Slide blocks or books under the legs at the head of your bed. Or, place a wedge under  the mattress. Many foam stores can make a suitable wedge for you. The wedge  should run from your waist to the top of your head.  Don't just prop your head on several pillows. This increases pressure on your  stomach. It can make GERD worse.  Watch Your Eating Habits Certain foods may increase  the acid in your stomach or relax the lower esophageal sphincter, making GERD more likely. It's best to avoid the following:  Coffee, tea, and carbonated drinks (with and without caffeine)  Fatty, fried, or spicy food  Mint, chocolate, onions, and tomatoes  Any other foods that seem to irritate your stomach or cause you pain  Relieve the Pressure  Eat smaller meals, even if you have to eat more often.  Don't lie down right after you eat. Wait a few hours for your stomach to empty.  Avoid tight belts and tight-fitting clothes.  Lose excess weight.  Tobacco and Alcohol  Avoid smoking tobacco and drinking alcohol. They can make GERD symptoms worse.

## 2020-12-24 NOTE — Progress Notes (Signed)
[name] [place] [address] Dept: [phone]  FOLLOW UP NOTE  Patient ID: Lorraine Wu, female    DOB: 08/15/1975  Age: 45 y.o. MRN: [id number] Date of Office Visit: 12/24/2020  Assessment  Chief Complaint: Other (Traveling next month needs a refill for the auvi-q )  HPI Lorraine Wu is a 45 year old female who presents to the clinic for follow-up visit.  She was last seen in this clinic on 09/17/2019 by Dr. Lucie Leather for evaluation of asthma, chronic urticaria, reflux, and food allergy to shellfish.  At today's visit, she reports her asthma has been well controlled with no shortness of breath, cough, or wheeze with activity or rest, with the exception of earlier today. She reports that, after entering an area that had just been cleaned with strong chemicals today, she became short of breath for a few minutes. She immediately left the area with the strong cleaning odors and her symptoms resolved quickly. She continues montelukast 10 mg once a day and has not needed to use albuterol or a long-acting beta agonist since her last visit to this clinic. Reflux is reported as well controlled with no symptoms including heartburn or vomiting with Dexilant 60 mg once a day.Chronic urticaria is reported as well controlled with no breakouts occurring since her last visit to this clinic. She continues Allegra 180 mg once a day.  She continues to avoid shellfish with no accidental ingestion or EpiPen use since her last visit to this clinic. She reports she is traveling to [place] next month to visit her family. An airline order for AuviQ and asthma inhalers was provided. Her current medications are listed in the chart.   Drug Allergies:  Allergies  Allergen Reactions  . Shellfish Allergy Hives, Shortness Of Breath, Nausea And Vomiting and Swelling    Throat itching  . Penicillins Hives and Swelling    Physical Exam: BP 126/80   Pulse 89   Temp 98.7 F (37.1 C)   Resp  18   Ht 5' (1.524 m)   Wt 154 lb 9.6 oz (70.1 kg)   SpO2 98%   BMI 30.19 kg/m    Physical Exam Vitals reviewed.  Constitutional:      Appearance: Normal appearance.  HENT:     Head: Normocephalic and atraumatic.     Right Ear: Tympanic membrane normal.     Left Ear: Tympanic membrane normal.     Nose:     Comments: Bilateral nares slightly erythematous with clear nasal drainage noted.  Pharynx normal.  Ears normal.  Eyes normal.    Mouth/Throat:     Pharynx: Oropharynx is clear.  Eyes:     Conjunctiva/sclera: Conjunctivae normal.  Cardiovascular:     Rate and Rhythm: Normal rate and regular rhythm.     Heart sounds: Normal heart sounds. No murmur heard.   Pulmonary:     Effort: Pulmonary effort is normal.     Breath sounds: Normal breath sounds.     Comments: Lungs clear to auscultation Musculoskeletal:        General: Normal range of motion.     Cervical back: Normal range of motion and neck supple.  Skin:    General: Skin is warm and dry.  Neurological:     Mental Status: She is alert and oriented to person, place, and time.  Psychiatric:        Mood and Affect: Mood normal.        Behavior: Behavior normal.  Thought Content: Thought content normal.        Judgment: Judgment normal.     Diagnostics: FVC 3.19, FEV1 2.50.  Predicted FVC 3.12, predicted FEV1 2.57.  Spirometry indicates normal ventilatory function.  Assessment and Plan: 1. Well controlled mild persistent asthma   2. Idiopathic urticaria   3. Seasonal and perennial allergic rhinitis   4. Anaphylactic shock due to food, subsequent encounter   5. Gastroesophageal reflux disease, unspecified whether esophagitis present     Meds ordered this encounter  Medications  . AUVI-Q 0.3 MG/0.3ML SOAJ injection    Sig: Use as directed for life-threatening allergic reaction.    Dispense:  2 each    Refill:  2    Patient's phone number is [phone].  Thank you - HMK    Patient Instructions   Asthma Continue montelukast 10 mg once a day to prevent cough and wheeze Continue albuterol 2 puffs once every 4 hours as needed for cough or wheeze May use albuterol 2 puffs 5 to 15 minutes before activity to prevent cough or wheeze For asthma flare, begin ArmonAir 113-1 puff twice a day for 2 weeks or until cough and wheeze free  Allergic rhinitis Continue allergen avoidance measures directed toward grass pollen, tree pollen dog, cat, dust mite, and cockroach as listed below Continue Allegra 180 mg once a day as needed for a runny nose Consider saline nasal rinses as needed for nasal symptoms. Use this before any medicated nasal sprays for best result  Chronic urticaria Continue Allegra 180 mg once a day If your symptoms re-occur, begin a journal of events that occurred for up to 6 hours before your symptoms began including foods and beverages consumed, soaps or perfumes you had contact with, and medications.   Reflux Continue dietary lifestyle modifications as listed below Continue Dexilant 60 mg once a day to control reflux  Food allergy Continue to avoid shellfish. In case of an allergic reaction, take Benadryl 50 mg every 4 hours, and if life-threatening symptoms occur, inject with AuviQ 0.3 mg.  Call the clinic if this treatment plan is not working well for you  Follow up in 1 year or sooner if needed.   Return in about 1 year (around 12/24/2021), or if symptoms worsen or fail to improve.    Thank you for the opportunity to care for this patient.  Please do not hesitate to contact me with questions.  Thermon Leyland, FNP [facility]

## 2021-03-18 ENCOUNTER — Ambulatory Visit: Payer: 59 | Admitting: Podiatry

## 2021-03-22 ENCOUNTER — Ambulatory Visit: Payer: 59

## 2021-03-22 ENCOUNTER — Other Ambulatory Visit: Payer: Self-pay

## 2021-03-22 ENCOUNTER — Ambulatory Visit: Payer: 59 | Admitting: Podiatry

## 2021-03-22 DIAGNOSIS — M7751 Other enthesopathy of right foot: Secondary | ICD-10-CM

## 2021-03-22 DIAGNOSIS — M2011 Hallux valgus (acquired), right foot: Secondary | ICD-10-CM

## 2021-03-22 NOTE — Progress Notes (Signed)
   Subjective: 45 y.o. female presents today for evaluation of right forefoot pain has been going on for few months now.  Patient has seen multiple physicians in the past.  She states that the last orthopedist that she saw at [facility] explained that her pain was coming from the bunion.  She would like to have a second opinion.  Currently she wears orthotics.  This is her second set of orthotics that are uncomfortable.  The first pair of orthotics were very comfortable and help support her foot better.  Current orthotics are about 46 and half years old she presents for further treatment and evaluation   Past Medical History:  Diagnosis Date   Allergy    Asthma    GERD (gastroesophageal reflux disease)    Heart murmur    History of insomnia    IBS (irritable bowel syndrome)    Migraine    Recurrent upper respiratory infection (URI)    Urticaria       Objective: Physical Exam General: The patient is alert and oriented x3 in no acute distress.  Dermatology: Skin is cool, dry and supple bilateral lower extremities. Negative for open lesions or macerations.  Vascular: Palpable pedal pulses bilaterally. No edema or erythema noted. Capillary refill within normal limits.  Neurological: Epicritic and protective threshold grossly intact bilaterally.   Musculoskeletal Exam: Clinical evidence of bunion deformity noted to the respective foot. There is moderate pain on palpation range of motion of the first MPJ. 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  Pain on palpation to the second MTPJ as well as with range of motion right foot.  Radiographic Exam: Patient declined x-rays since she has been taken at [facility]  Assessment: 1. HAV w/ bunion deformity right; mild 2.  Second MTPJ capsulitis right   Plan of Care:  1. Patient was evaluated.  2.  Explained to the patient that her pain is coming from the second MTP joint.  This is associated with  bunion deformity since an increased bunion deformity increases pressure to that joint. 3.  Explained to the patient that orthotics should help.  Prescription was provided for the patient to take to the original orthotics lab to have new orthotics made with a second MTP offload 4.  Return to clinic as needed  *Works at [employer] department      Felecia Shelling, DPM [facility]  Dr. Felecia Shelling, DPM    [address] [place], [place] [address]                Office [phone]  Fax [phone]

## 2021-07-12 ENCOUNTER — Ambulatory Visit
Admission: RE | Admit: 2021-07-12 | Discharge: 2021-07-12 | Disposition: A | Discharge status: Home / Self Care | Payer: 59 | Source: Ambulatory Visit | Attending: Physical Medicine & Rehabilitation | Admitting: Physical Medicine & Rehabilitation

## 2021-07-12 ENCOUNTER — Other Ambulatory Visit: Payer: Self-pay | Admitting: Physical Medicine & Rehabilitation

## 2021-07-12 DIAGNOSIS — M47816 Spondylosis without myelopathy or radiculopathy, lumbar region: Secondary | ICD-10-CM

## 2021-07-12 DIAGNOSIS — M47812 Spondylosis without myelopathy or radiculopathy, cervical region: Secondary | ICD-10-CM

## 2021-12-28 ENCOUNTER — Encounter: Payer: Self-pay | Admitting: Allergy and Immunology

## 2021-12-28 ENCOUNTER — Ambulatory Visit: Payer: 59 | Admitting: Allergy and Immunology

## 2021-12-28 VITALS — BP 118/78 | HR 83 | Temp 98.1°F | Resp 16 | Ht 60.0 in | Wt 155.0 lb

## 2021-12-28 DIAGNOSIS — L501 Idiopathic urticaria: Secondary | ICD-10-CM | POA: Diagnosis not present

## 2021-12-28 DIAGNOSIS — J3089 Other allergic rhinitis: Secondary | ICD-10-CM | POA: Diagnosis not present

## 2021-12-28 DIAGNOSIS — J302 Other seasonal allergic rhinitis: Secondary | ICD-10-CM

## 2021-12-28 DIAGNOSIS — T7800XD Anaphylactic reaction due to unspecified food, subsequent encounter: Secondary | ICD-10-CM

## 2021-12-28 DIAGNOSIS — K219 Gastro-esophageal reflux disease without esophagitis: Secondary | ICD-10-CM

## 2021-12-28 DIAGNOSIS — J4530 Mild persistent asthma, uncomplicated: Secondary | ICD-10-CM

## 2021-12-28 MED ORDER — EPINEPHRINE 0.3 MG/0.3ML IJ SOAJ: 0.3000 mg | Freq: Once | INTRAMUSCULAR | 2 refills | Status: AC

## 2021-12-28 MED ORDER — MONTELUKAST SODIUM 10 MG PO TABS: 10.0000 mg | ORAL_TABLET | Freq: Every day | ORAL | 1 refills | Status: DC

## 2021-12-28 NOTE — Patient Instructions (Addendum)
  1.  EpiPen, Benadryl, MD/ER evaluation for allergic reaction  2.  Continue Allegra 180-1 tablet 1 time per day  3.  Continue montelukast 10 mg - 1 tablet 1 time per day  4.  Continue albuterol HFA-2 inhalations every 4-6 hours if needed  5. Continue OTC omeprazole if needed  6.  Return to clinic in 1 year or earlier if problem

## 2021-12-28 NOTE — Progress Notes (Unsigned)
[facility] [place] [facility] [place] [facility] [place]   Follow-up Note  Referring Provider: Reeves Dam, MD Primary Provider: Arrie Aran, NP Date of Office Visit: 12/28/2021  Subjective:   Lorraine Wu (DOB: 02-01-1976) is a 46 y.o. female who [facility]21 in re-evaluation of the following:  HPI: Lorraine Wu returns to this clinic in reevaluation of asthma, chronic urticaria, reflux, and food allergy directed against shellfish.  I have not seen her in this clinic since 17 September 2019.  She did visit with our nurse practitioner on 24 Dec 2020.  She has really done well since her last visit.  Her urticaria is under excellent control while using Allegra just 1 time per day.  She remains away from consumption of shellfish.  She does have an injectable epinephrine device.  Her reflux is under very good control at this point in time with intermittent use of over-the-counter omeprazole.  She has minimize caffeine consumption.  She rarely has any problems with her upper or lower airways and she rarely uses any short acting bronchodilator.  She has not required a systemic steroid or antibiotic for any type of airway issue.  She continues to use montelukast consistently.  She remains away from consumption of shellfish.  Allergies as of 12/28/2021       Reactions   Shellfish Allergy Hives, Shortness Of Breath, Nausea And Vomiting, Swelling   Throat itching   Penicillins Hives, Swelling        Medication List    Auvi-Q 0.3 mg/0.3 mL Soaj injection Generic drug: EPINEPHrine Use as directed for life-threatening allergic reaction.   CALCIUM PO Take 2,000 Units by mouth daily.   eletriptan 20 MG tablet Commonly known as: RELPAX Take 20 mg by mouth daily as needed.   fexofenadine 180 MG tablet Commonly known as: ALLEGRA Take 1 tablet (180 mg total) by mouth daily.   hydrOXYzine 25 MG tablet Commonly known as:  ATARAX Take 25 mg by mouth 3 (three) times daily.   levothyroxine 75 MCG tablet Commonly known as: SYNTHROID levothyroxine 75 mcg tablet  TAKE 1 TABLET EVERY OTHER DAY BY ORAL ROUTE FOR 30 DAYS.   levothyroxine 100 MCG tablet Commonly known as: SYNTHROID Take by mouth.   montelukast 10 MG tablet Commonly known as: SINGULAIR TAKE 1 TABLET BY MOUTH EVERY DAY   topiramate 100 MG tablet Commonly known as: TOPAMAX Take 100 mg by mouth every evening.   traMADol 50 MG tablet Commonly known as: ULTRAM Take 50 mg by mouth as needed.   Ubrelvy 100 MG Tabs Generic drug: Ubrogepant Take 1 tablet by mouth daily as needed.    Past Medical History:  Diagnosis Date   Allergy    Asthma    GERD (gastroesophageal reflux disease)    Heart murmur    History of insomnia    IBS (irritable bowel syndrome)    Migraine    Recurrent upper respiratory infection (URI)    Urticaria     Past Surgical History:  Procedure Laterality Date   ABDOMINAL HYSTERECTOMY  2011   CYSTECTOMY Right    02/2013 hand, eyebrow and head 05/2013   WISDOM TOOTH EXTRACTION     x4    Review of systems negative except as noted in HPI / PMHx or noted below:  Review of Systems  Constitutional: Negative.   HENT: Negative.    Eyes: Negative.   Respiratory: Negative.    Cardiovascular: Negative.  Gastrointestinal: Negative.   Genitourinary: Negative.   Musculoskeletal: Negative.   Skin: Negative.   Neurological: Negative.   Endo/Heme/Allergies: Negative.   Psychiatric/Behavioral: Negative.      Objective:   Vitals:   12/28/21 1641  BP: 118/78  Pulse: 83  Resp: 16  Temp: 98.1 F (36.7 C)  SpO2: 99%   Height: 5' (152.4 cm)  Weight: 155 lb (70.3 kg)   Physical Exam Constitutional:      Appearance: She is not diaphoretic.  HENT:     Head: Normocephalic.     Right Ear: Tympanic membrane, ear canal and external ear normal.     Left Ear: Tympanic membrane, ear canal and external ear normal.      Nose: Nose normal. No mucosal edema or rhinorrhea.     Mouth/Throat:     Pharynx: Uvula midline. No oropharyngeal exudate.  Eyes:     Conjunctiva/sclera: Conjunctivae normal.  Neck:     Thyroid: No thyromegaly.     Trachea: Trachea normal. No tracheal tenderness or tracheal deviation.  Cardiovascular:     Rate and Rhythm: Normal rate and regular rhythm.     Heart sounds: Normal heart sounds, S1 normal and S2 normal. No murmur heard. Pulmonary:     Effort: No respiratory distress.     Breath sounds: Normal breath sounds. No stridor. No wheezing or rales.  Lymphadenopathy:     Head:     Right side of head: No tonsillar adenopathy.     Left side of head: No tonsillar adenopathy.     Cervical: No cervical adenopathy.  Skin:    Findings: No erythema or rash.     Nails: There is no clubbing.  Neurological:     Mental Status: She is alert.    Diagnostics: none  Assessment and Plan:   1. Idiopathic urticaria   2. Seasonal and perennial allergic rhinitis   3. Anaphylactic shock due to food, subsequent encounter   4. Gastroesophageal reflux disease, unspecified whether esophagitis present   5. Well controlled mild persistent asthma     1.  EpiPen, Benadryl, MD/ER evaluation for allergic reaction  2.  Continue Allegra 180-1 tablet 1 time per day  3.  Continue montelukast 10 mg - 1 tablet 1 time per day  4.  Continue albuterol HFA-2 inhalations every 4-6 hours if needed  5. Continue OTC omeprazole if needed  6.  Return to clinic in 1 year or earlier if problem  Lorraine Wu is really doing very well and she has a good understanding about her disease status and appropriate dosing of her medications depending on disease activity.  Assuming she does well with the plan noted above I will see her back in this clinic in 1 year or earlier if there is a problem.  Lorraine Schimke, MD Allergy / Immunology [facility]

## 2021-12-29 ENCOUNTER — Encounter: Payer: Self-pay | Admitting: Allergy and Immunology

## 2022-04-26 ENCOUNTER — Encounter: Payer: Self-pay | Admitting: Internal Medicine

## 2022-04-26 ENCOUNTER — Telehealth: Payer: Self-pay

## 2022-04-26 ENCOUNTER — Ambulatory Visit (HOSPITAL_BASED_OUTPATIENT_CLINIC_OR_DEPARTMENT_OTHER)
Admission: RE | Admit: 2022-04-26 | Discharge: 2022-04-26 | Disposition: A | Discharge status: Home / Self Care | Payer: 59 | Source: Ambulatory Visit | Attending: Family | Admitting: Family

## 2022-04-26 ENCOUNTER — Ambulatory Visit: Payer: 59 | Admitting: Family

## 2022-04-26 VITALS — BP 118/76 | HR 102 | Temp 97.9°F | Resp 20 | Wt 153.4 lb

## 2022-04-26 DIAGNOSIS — J3089 Other allergic rhinitis: Secondary | ICD-10-CM

## 2022-04-26 DIAGNOSIS — L501 Idiopathic urticaria: Secondary | ICD-10-CM | POA: Diagnosis not present

## 2022-04-26 DIAGNOSIS — J4531 Mild persistent asthma with (acute) exacerbation: Secondary | ICD-10-CM

## 2022-04-26 DIAGNOSIS — R058 Other specified cough: Secondary | ICD-10-CM | POA: Insufficient documentation

## 2022-04-26 DIAGNOSIS — K219 Gastro-esophageal reflux disease without esophagitis: Secondary | ICD-10-CM

## 2022-04-26 NOTE — Progress Notes (Signed)
Please let Lorraine Wu know that her chest x-ray is normal. This is great news!!!

## 2022-04-26 NOTE — Patient Instructions (Addendum)
Asthma Please get stat PA and lateral chest x-ray due to productive cough with dried blood and we will call you with results once they are back Start prednisone 10 mg taking 1 tablet twice a day for 4 days, then on the fifth day take 1 tablet and stop Continue montelukast 10 mg once a day to prevent cough and wheeze Continue albuterol 2 puffs once every 4 hours as needed for cough or wheeze May use albuterol 2 puffs 5 to 15 minutes before activity to prevent cough or wheeze For asthma flare, begin ArmonAir 113-1 puff twice a day for 2 weeks or until cough and wheeze free.  Reviewed when to properly use ArmonAir  Allergic rhinitis Continue allergen avoidance measures directed toward grass pollen, tree pollen dog, cat, dust mite, and cockroach as listed below Continue Allegra 180 mg once a day as needed for a runny nose Consider saline nasal rinses as needed for nasal symptoms. Use this before any medicated nasal sprays for best result  Chronic urticaria Continue Allegra 180 mg once a day.   Reflux Continue dietary lifestyle modifications as listed below Continue famotidine 20 mg once a day to control reflux  Food allergy Continue to avoid shellfish. In case of an allergic reaction, take Benadryl 50 mg every 4 hours, and if life-threatening symptoms occur, inject with EpiPen 0.3 mg.   Follow up in 3-4 months or sooner if needed.

## 2022-04-26 NOTE — Progress Notes (Deleted)
Follow Up Note  RE: Lorraine Wu MRN: [id number] DOB: [birth year] Date of Office Visit: 04/26/2022  Referring provider: Leeann Must, NP Primary care provider: Leeann Must, NP  Chief Complaint: Cough (Cough, asthma, loss voice saw pcp yesterday told not congested asthma exacerbation. Treating with honey lemon and ginger.)  History of Present Illness: I had the pleasure of seeing Lorraine Wu for a follow up visit at the [facility] on 04/26/2022. She is a 46 y.o. female, who is being followed for persistent asthma, allergic rhinitis, urticaria reflux, food allergy . Her previous allergy office visit was on 12/28/21 with Dr. Neldon Wu. Today is a  acute visit for cough .  History obtained from patient, chart review.  ***  Assessment and Plan: Lorraine Wu is a 46 y.o. female with: No diagnosis found. Plan: Patient Instructions  Asthma Continue montelukast 10 mg once a day to prevent cough and wheeze Continue albuterol 2 puffs once every 4 hours as needed for cough or wheeze May use albuterol 2 puffs 5 to 15 minutes before activity to prevent cough or wheeze For asthma flare, begin ArmonAir 113-1 puff twice a day for 2 weeks or until cough and wheeze free  Allergic rhinitis Continue allergen avoidance measures directed toward grass pollen, tree pollen dog, cat, dust mite, and cockroach as listed below Continue Allegra 180 mg once a day as needed for a runny nose Consider saline nasal rinses as needed for nasal symptoms. Use this before any medicated nasal sprays for best result  Chronic urticaria Continue Allegra 180 mg once a day If your symptoms re-occur, begin a journal of events that occurred for up to 6 hours before your symptoms began including foods and beverages consumed, soaps or perfumes you had contact with, and medications.   Reflux Continue dietary lifestyle modifications as listed below Continue Dexilant 60 mg once a day to  control reflux  Food allergy Continue to avoid shellfish. In case of an allergic reaction, take Benadryl 50 mg every 4 hours, and if life-threatening symptoms occur, inject with AuviQ 0.3 mg.  Call the clinic if this treatment plan is not working well for you  Follow up in 1 year or sooner if needed.     No follow-ups on file.  No orders of the defined types were placed in this encounter.   Lab Orders  No laboratory test(s) ordered today   Diagnostics: Spirometry:  Tracings reviewed. Her effort: {Blank single:19197::"Good reproducible efforts.","It was hard to get consistent efforts and there is a question as to whether this reflects a maximal maneuver.","Poor effort, data can not be interpreted."} FVC: ***L FEV1: ***L, ***% predicted FEV1/FVC ratio: ***% Interpretation: {Blank single:19197::"Spirometry consistent with mild obstructive disease","Spirometry consistent with moderate obstructive disease","Spirometry consistent with severe obstructive disease","Spirometry consistent with possible restrictive disease","Spirometry consistent with mixed obstructive and restrictive disease","Spirometry uninterpretable due to technique","Spirometry consistent with normal pattern","No overt abnormalities noted given today's efforts"}.  Please see scanned spirometry results for details.  Skin Testing: {Blank single:19197::"Select foods","Environmental allergy panel","Environmental allergy panel and select foods","Food allergy panel","None","Deferred due to recent antihistamines use"}. *** Results interpreted by myself during this encounter and discussed with patient/family.   Medication List:  Current Outpatient Medications  Medication Sig Dispense Refill   fexofenadine (ALLEGRA) 180 MG tablet Take 1 tablet (180 mg total) by mouth daily. 30 tablet 5   levothyroxine (SYNTHROID) 100 MCG tablet Take by mouth.     levothyroxine (SYNTHROID) 75 MCG tablet levothyroxine 75 mcg tablet  TAKE 1  TABLET EVERY OTHER DAY BY ORAL ROUTE FOR 30 DAYS.     montelukast (SINGULAIR) 10 MG tablet Take 1 tablet (10 mg total) by mouth daily. 90 tablet 1   topiramate (TOPAMAX) 100 MG tablet Take 100 mg by mouth every evening.   6   UBRELVY 100 MG TABS Take 1 tablet by mouth daily as needed.     No current facility-administered medications for this visit.   Allergies: Allergies  Allergen Reactions   Shellfish Allergy Hives, Shortness Of Breath, Nausea And Vomiting and Swelling    Throat itching   Penicillins Hives and Swelling   I reviewed her past medical history, social history, family history, and environmental history and no significant changes have been reported from her previous visit.  ROS: All others negative except as noted per HPI.   Objective: BP 118/76 (BP Location: Left Arm, Patient Position: Sitting, Cuff Size: Normal)   Pulse (!) 102   Temp 97.9 F (36.6 C) (Temporal)   Resp 20   Wt 153 lb 6.4 oz (69.6 kg)   SpO2 99%   BMI 29.96 kg/m  Body mass index is 29.96 kg/m. General Appearance:  Alert, cooperative, no distress, appears stated age  Head:  Normocephalic, without obvious abnormality, atraumatic  Eyes:  Conjunctiva clear, EOM's intact  Nose: Nares normal, {Blank multiple:19196:a:"***","hypertrophic turbinates","normal mucosa","no visible anterior polyps","septum midline"}  Throat: Lips, tongue normal; teeth and gums normal, {Blank multiple:19196:a:"***","normal posterior oropharynx","tonsils 2+","tonsils 3+","no tonsillar exudate","+ cobblestoning"}  Neck: Supple, symmetrical  Lungs:   {Blank multiple:19196:a:"***","clear to auscultation bilaterally","end-expiratory wheezing","wheezing throughout"}, Respirations unlabored, {Blank multiple:19196:a:"***","no coughing","intermittent dry coughing"}  Heart:  {Blank multiple:19196:a:"***","regular rate and rhythm","no murmur"}, Appears well perfused  Extremities: No edema  Skin: Skin color, texture, turgor normal, no  rashes or lesions on visualized portions of skin {Blank multiple:19196:a:""***"}  Neurologic: No gross deficits   Previous notes and tests were reviewed. The plan was reviewed with the patient/family, and all questions/concerned were addressed.  It was my pleasure to see Lorraine Wu today and participate in her care. Please feel free to contact me with any questions or concerns.  Sincerely,  Ferol Luz, MD  Allergy & Immunology  [facility] Office: [phone]

## 2022-04-26 NOTE — Progress Notes (Signed)
[address] [place] [address] Dept: [phone]  FOLLOW UP NOTE  Patient ID: Lorraine Wu, female    DOB: 21-Apr-1976  Age: 46 y.o. MRN: [id number] Date of Office Visit: 04/26/2022  Assessment  Chief Complaint: Cough (Cough, asthma, loss voice saw pcp yesterday told not congested asthma exacerbation. Treating with honey lemon and ginger.)  HPI Lorraine Wu is a 46 year old female who presents today for an acute visit of cough and loss of voice.  She was last seen on Dec 28, 2021 by Dr. Lucie Leather for idiopathic urticaria, seasonal and perennial allergic rhinitis, anaphylactic shock due to food, gastroesophageal reflux disease, and well-controlled mild persistent asthma.  She denies any new diagnosis or surgery since her last office visit.  She reports last Wednesday she was drinking water and got choked.  After that episode she continued to cough.  On Saturday she felt well enough to go to [place], but she still had a light cough.  The cough has progressed and not gotten any better.  She has been treating her cough with honey, lemon, and ginger.  For the past 2 mornings she has had a productive cough with clear/brown and dried blood sputum.  The rest of the day her sputum is clear.  She has also had some shortness of breath since Sunday.  She denies wheezing, tightness in chest, and nocturnal awakenings due to breathing problems.  Shee also denies fever, chills, fatigue, and body ache.  She does mention though that on Friday she thought she had a fever because she was cold but hot to the touch.  Her temperature was 98.2. Her coughing at night is waking her husband up, but not her.  She did try using her albuterol inhaler twice on Sunday and this helped.  Prior to that she had not used her albuterol inhaler in a year.  She also has ArmonAir 113 mcg 1 puff twice a day to use for asthma flares.  She has used this at a time or 2.  Reviewed when to use ArmonAir.  She has not needed  an antibiotic or steroid in a couple of years.  She continues to take montelukast 10 mg once a day.  Idiopathic urticaria is reported as controlled with Allegra 180 mg once a day.  She has not had any rashes or hives.  Seasonal and perennial allergic rhinitis is reported as controlled with Allegra 180 mg once a day day and montelukast 10 mg today.  She denies rhinorrhea, nasal congestion, and postnasal drip.  She has not had any sinus infections since we last saw her.  She continues to avoid shellfish without any accidental ingestion or use of her epinephrine autoinjector device.  Her epinephrine autoinjector device is up-to-date.  Gastroesophageal reflux disease: She denies any heartburn or reflux symptoms.  Approximately a month ago her primary care physician changed her to famotidine 20 mg once a day.  Previously she had been taking some of her husbands omeprazole because he was not taking it. He is now taking his omeprazole so her primary care physician prescribed  her famotidine.   Drug Allergies:  Allergies  Allergen Reactions   Shellfish Allergy Hives, Shortness Of Breath, Nausea And Vomiting and Swelling    Throat itching   Penicillins Hives and Swelling    Review of Systems: Review of Systems  Constitutional:  Negative for chills, fever and malaise/fatigue.  HENT:         Denies rhinorrhea, nasal congestion, and postnasal drip  Eyes:        Denies itchy watery eyes  Respiratory:  Positive for cough and shortness of breath. Negative for wheezing.        Reports cough that is productive in the morning.  The sputum is clear, dried blood, and brown.  She also reports shortness of breath since Sunday.  She denies wheezing, tightness in chest, and nocturnal awakenings due to breathing problems  Cardiovascular:  Negative for chest pain and palpitations.  Gastrointestinal:        Denies heartburn or reflux symptoms while taking famotidine 20 mg once a day  Genitourinary:  Negative for  frequency.  Skin:  Negative for itching and rash.  Neurological:  Positive for headaches.       Reports that she will have a lot more migraines due to the weather  Endo/Heme/Allergies:  Positive for environmental allergies.     Physical Exam: BP 118/76 (BP Location: Left Arm, Patient Position: Sitting, Cuff Size: Normal)   Pulse (!) 102   Temp 97.9 F (36.6 C) (Temporal)   Resp 20   Wt 153 lb 6.4 oz (69.6 kg)   SpO2 99%   BMI 29.96 kg/m    Physical Exam Constitutional:      Appearance: Normal appearance.  HENT:     Head: Normocephalic and atraumatic.     Comments: Pharynx normal, eyes normal, ears normal, nose: Normal    Right Ear: Tympanic membrane, ear canal and external ear normal.     Left Ear: Tympanic membrane, ear canal and external ear normal.     Nose: Nose normal.     Mouth/Throat:     Mouth: Mucous membranes are moist.     Pharynx: Oropharynx is clear.  Eyes:     Conjunctiva/sclera: Conjunctivae normal.  Cardiovascular:     Rate and Rhythm: Regular rhythm. Tachycardia present.     Heart sounds: Normal heart sounds.  Pulmonary:     Effort: Pulmonary effort is normal.     Breath sounds: Normal breath sounds.     Comments: Lungs clear to auscultation Musculoskeletal:     Cervical back: Neck supple.  Skin:    General: Skin is warm.  Neurological:     Mental Status: She is alert and oriented to person, place, and time.  Psychiatric:        Mood and Affect: Mood normal.        Behavior: Behavior normal.        Thought Content: Thought content normal.        Judgment: Judgment normal.     Diagnostics: FVC 2.72 L (86%), FEV1 1.91 L (74%).  Predicted FVC 3.17 L, predicted FEV1 2.57 L.  Spirometry indicates possible mild obstruction.  Assessment and Plan: 1. Mild persistent asthma with (acute) exacerbation   2. Productive cough   3. Idiopathic urticaria   4. Seasonal and perennial allergic rhinitis   5. Gastroesophageal reflux disease, unspecified  whether esophagitis present     No orders of the defined types were placed in this encounter.   Patient Instructions  Asthma Please get stat PA and lateral chest x-ray due to productive cough with dried blood and we will call you with results once they are back Start prednisone 10 mg taking 1 tablet twice a day for 4 days, then on the fifth day take 1 tablet and stop Continue montelukast 10 mg once a day to prevent cough and wheeze Continue albuterol 2 puffs once every 4 hours as needed for cough  or wheeze May use albuterol 2 puffs 5 to 15 minutes before activity to prevent cough or wheeze For asthma flare, begin ArmonAir 113-1 puff twice a day for 2 weeks or until cough and wheeze free.  Reviewed when to properly use ArmonAir  Allergic rhinitis Continue allergen avoidance measures directed toward grass pollen, tree pollen dog, cat, dust mite, and cockroach as listed below Continue Allegra 180 mg once a day as needed for a runny nose Consider saline nasal rinses as needed for nasal symptoms. Use this before any medicated nasal sprays for best result  Chronic urticaria Continue Allegra 180 mg once a day.   Reflux Continue dietary lifestyle modifications as listed below Continue famotidine 20 mg once a day to control reflux  Food allergy Continue to avoid shellfish. In case of an allergic reaction, take Benadryl 50 mg every 4 hours, and if life-threatening symptoms occur, inject with EpiPen 0.3 mg.   Follow up in 3-4 months or sooner if needed.      Return in about 3 months (around 07/26/2022), or if symptoms worsen or fail to improve.    Thank you for the opportunity to care for this patient.  Please do not hesitate to contact me with questions.  Althea Charon, FNP [facility]

## 2022-04-26 NOTE — Telephone Encounter (Signed)
Called [facility] plan at [phone] per Shanice T no prior auth needed for xrays ap/lat [phone] img 36. Patient is going to [facility] in [place]

## 2022-08-26 NOTE — Patient Instructions (Addendum)
Asthma Continue montelukast 10 mg once a day to prevent cough and wheeze Stop albuterol albuterol During asthma symptoms start AirSupra taking 2 puffs as needed for cough, wheeze, tightness in chest, or shortness of breath. Do not use more than 12 inhalations in 24 hours. Sample given along with coupon Stop ArmonAir 113 mcg for asthma flares  Allergic rhinitis Continue allergen avoidance measures directed toward grass pollen, tree pollen dog, cat, dust mite, and cockroach as listed below Continue Allegra 180 mg once a day as needed for a runny nose Consider saline nasal rinses as needed for nasal symptoms. Use this before any medicated nasal sprays for best result  Chronic urticaria Continue Allegra 180 mg once a day.   Reflux Continue dietary lifestyle modifications as listed below Continue famotidine 20 mg twice a day to control reflux  Food allergy Continue to avoid shellfish. In case of an allergic reaction, take Benadryl 50 mg every 4 hours, and if life-threatening symptoms occur, inject with EpiPen 0.3 mg.   Follow up in 6 months or sooner if needed.

## 2022-08-29 ENCOUNTER — Other Ambulatory Visit: Payer: Self-pay

## 2022-08-29 ENCOUNTER — Encounter: Payer: Self-pay | Admitting: Family

## 2022-08-29 ENCOUNTER — Ambulatory Visit: Payer: 59 | Admitting: Family

## 2022-08-29 VITALS — BP 102/68 | HR 79 | Temp 98.5°F | Resp 16 | Ht 60.0 in | Wt 161.0 lb

## 2022-08-29 DIAGNOSIS — K219 Gastro-esophageal reflux disease without esophagitis: Secondary | ICD-10-CM

## 2022-08-29 DIAGNOSIS — T7800XD Anaphylactic reaction due to unspecified food, subsequent encounter: Secondary | ICD-10-CM | POA: Diagnosis not present

## 2022-08-29 DIAGNOSIS — L501 Idiopathic urticaria: Secondary | ICD-10-CM

## 2022-08-29 DIAGNOSIS — J4530 Mild persistent asthma, uncomplicated: Secondary | ICD-10-CM | POA: Diagnosis not present

## 2022-08-29 DIAGNOSIS — J3089 Other allergic rhinitis: Secondary | ICD-10-CM

## 2022-08-29 DIAGNOSIS — J302 Other seasonal allergic rhinitis: Secondary | ICD-10-CM

## 2022-08-29 MED ORDER — AIRSUPRA 90-80 MCG/ACT IN AERO: 2.0000 | INHALATION_SPRAY | RESPIRATORY_TRACT | 1 refills | Status: DC | PRN

## 2022-08-29 NOTE — Progress Notes (Signed)
[address] [place] [address] Dept: [phone]  FOLLOW UP NOTE  Patient ID: Lorraine Wu, female    DOB: 20-Nov-1975  Age: 47 y.o. MRN: [id number] Date of Office Visit: 08/29/2022  Assessment  Chief Complaint: Mild persistent asthma and Follow-up  HPI Lorraine Wu is a 47 year old female who presents today for follow-up of mild persistent asthma with acute exacerbation, productive cough, idiopathic urticaria, seasonal and perennial allergic rhinitis, and gastroesophageal reflux disease.  She was last seen on April 26, 2022 by myself.  She denies any new diagnosis or surgery since her last office visit.  Mild persistent asthma: She continues to take montelukast 10 mg once a day and has albuterol to use as needed.  She does not have ArmonAir 113 mcg to use for asthma flares.  After her office visit with us,the next day a friend's family member that is in healthcare came over and gave her a steroid injection and gave her a steroid pack to take also in addition to the prednisone she was given by myself.  She also gave her a steroid inhaler that she cannot remember the name of.  Four weeks later her voice came back and the shortness of breath got better.  Around Thanksgiving the nonproductive cough went away.  She has not had any coughing, wheezing, tightness in chest, shortness of breath, and nocturnal awakenings due to breathing problems since.  She has not made any trips to the emergency room or urgent care due to breathing problems.  She has not had to use her albuterol inhaler since Thanksgiving.  Allergic rhinitis: She continues to take Allegra 180 mg once a day.  She denies rhinorrhea, nasal congestion, and postnasal drip.  She has not had any sinus infections since we last saw her.  Reflux is reported as mostly controlled with famotidine 20 mg twice a day.  She will only have reflux symptoms if she eats certain foods that she knows are triggers.  Chronic  urticaria is reported as controlled with Allegra 180 mg once a day and famotidine 20 mg twice a day.  She has not had any hives since her last office visit.  Food allergy: She continues to avoid shellfish without any accidental ingestion or use of her epinephrine autoinjector device.  Her EpiPen is up-to-date and she thinks it expires in May or June.  Instructed her that we can go ahead and refill this.  She reports that she has an appointment with her primary care physician in March where she is going to ask for refills and she will ask for refill for her EpiPen also.  Instructed her to call our office if they are not able to refill her EpiPen.  She verbalizes understanding.     Drug Allergies:  Allergies  Allergen Reactions   Shellfish Allergy Hives, Shortness Of Breath, Nausea And Vomiting and Swelling    Throat itching   Penicillins Hives and Swelling    Review of Systems: Review of Systems  Constitutional:  Negative for fever.  HENT:         Denies rhinorrhea, nasal congestion, and postnasal drip  Eyes:        Reports watery eyes due to cold weather and migraines  Respiratory:  Negative for cough, shortness of breath and wheezing.        Since Thanksgiving she denies cough, wheeze, tightness in chest, shortness of breath, and nocturnal awakenings due to breathing problems  Gastrointestinal:        Reports  reflux symptoms only if she eats something that she knows she should not.  Genitourinary:  Negative for frequency.  Skin:  Negative for itching and rash.  Neurological:  Positive for headaches.       Reports migraines due to cold weather.  She reports cold weather is a trigger for her migraines.  Endo/Heme/Allergies:  Positive for environmental allergies.     Physical Exam: BP 102/68   Pulse 79   Temp 98.5 F (36.9 C) (Temporal)   Resp 16   Ht 5' (1.524 m)   Wt 161 lb (73 kg)   SpO2 98%   BMI 31.44 kg/m    Physical Exam Constitutional:      Appearance: Normal  appearance.  HENT:     Head: Normocephalic and atraumatic.     Comments: Pharynx normal, eyes normal, ears normal, nose normal    Right Ear: Tympanic membrane, ear canal and external ear normal.     Left Ear: Tympanic membrane, ear canal and external ear normal.     Nose: Nose normal.     Mouth/Throat:     Mouth: Mucous membranes are moist.     Pharynx: Oropharynx is clear.  Eyes:     Conjunctiva/sclera: Conjunctivae normal.  Cardiovascular:     Rate and Rhythm: Normal rate and regular rhythm.     Heart sounds: Normal heart sounds.  Pulmonary:     Effort: Pulmonary effort is normal.     Breath sounds: Normal breath sounds.     Comments: Lungs clear to auscultation Musculoskeletal:     Cervical back: Neck supple.  Skin:    General: Skin is warm.  Neurological:     Mental Status: She is alert and oriented to person, place, and time.  Psychiatric:        Mood and Affect: Mood normal.        Behavior: Behavior normal.        Thought Content: Thought content normal.        Judgment: Judgment normal.     Diagnostics: FVC 2.97 L (96%), FEV1 2.19 L (87%).  Predicted FVC 3.09 L, predicted FEV1 2.52 L.  Spirometry indicates normal ventilatory function.  Assessment and Plan: 1. Well controlled mild persistent asthma   2. Seasonal and perennial allergic rhinitis   3. Idiopathic urticaria   4. Anaphylactic shock due to food, subsequent encounter   5. Gastroesophageal reflux disease, unspecified whether esophagitis present     Meds ordered this encounter  Medications   Albuterol-Budesonide (AIRSUPRA) 90-80 MCG/ACT AERO    Sig: Inhale 2 puffs into the lungs as needed. Inhale 2 puffs as needed for cough, wheeze, tightness in chest, or shortness of breath. Do not exceed 12 puffs in 24 hours    Dispense:  10.7 g    Refill:  1    CBU:[id number],IWO: 54,GRP: [id number],OI: [id number]    Patient Instructions  Asthma Continue montelukast 10 mg once a day to prevent cough and  wheeze Stop albuterol albuterol During asthma symptoms start AirSupra taking 2 puffs as needed for cough, wheeze, tightness in chest, or shortness of breath. Do not use more than 12 inhalations in 24 hours. Sample given along with coupon Stop ArmonAir 113 mcg for asthma flares  Allergic rhinitis Continue allergen avoidance measures directed toward grass pollen, tree pollen dog, cat, dust mite, and cockroach as listed below Continue Allegra 180 mg once a day as needed for a runny nose Consider saline nasal rinses as needed for nasal symptoms. Use this  before any medicated nasal sprays for best result  Chronic urticaria Continue Allegra 180 mg once a day.   Reflux Continue dietary lifestyle modifications as listed below Continue famotidine 20 mg twice a day to control reflux  Food allergy Continue to avoid shellfish. In case of an allergic reaction, take Benadryl 50 mg every 4 hours, and if life-threatening symptoms occur, inject with EpiPen 0.3 mg.   Follow up in 6 months or sooner if needed.     Return in about 6 months (around 02/27/2023), or if symptoms worsen or fail to improve.    Thank you for the opportunity to care for this patient.  Please do not hesitate to contact me with questions.  Althea Charon, FNP [facility]

## 2023-03-22 IMAGING — CR DG CERVICAL SPINE 2 OR 3 VIEWS
2 series · 2 of 2 positions shown · non-contrast
Comparison: None.

CLINICAL DATA: Osteoarthritis of spine.  Pain.

EXAM:
CERVICAL SPINE - 2-3 VIEW

[w c-spine lat]
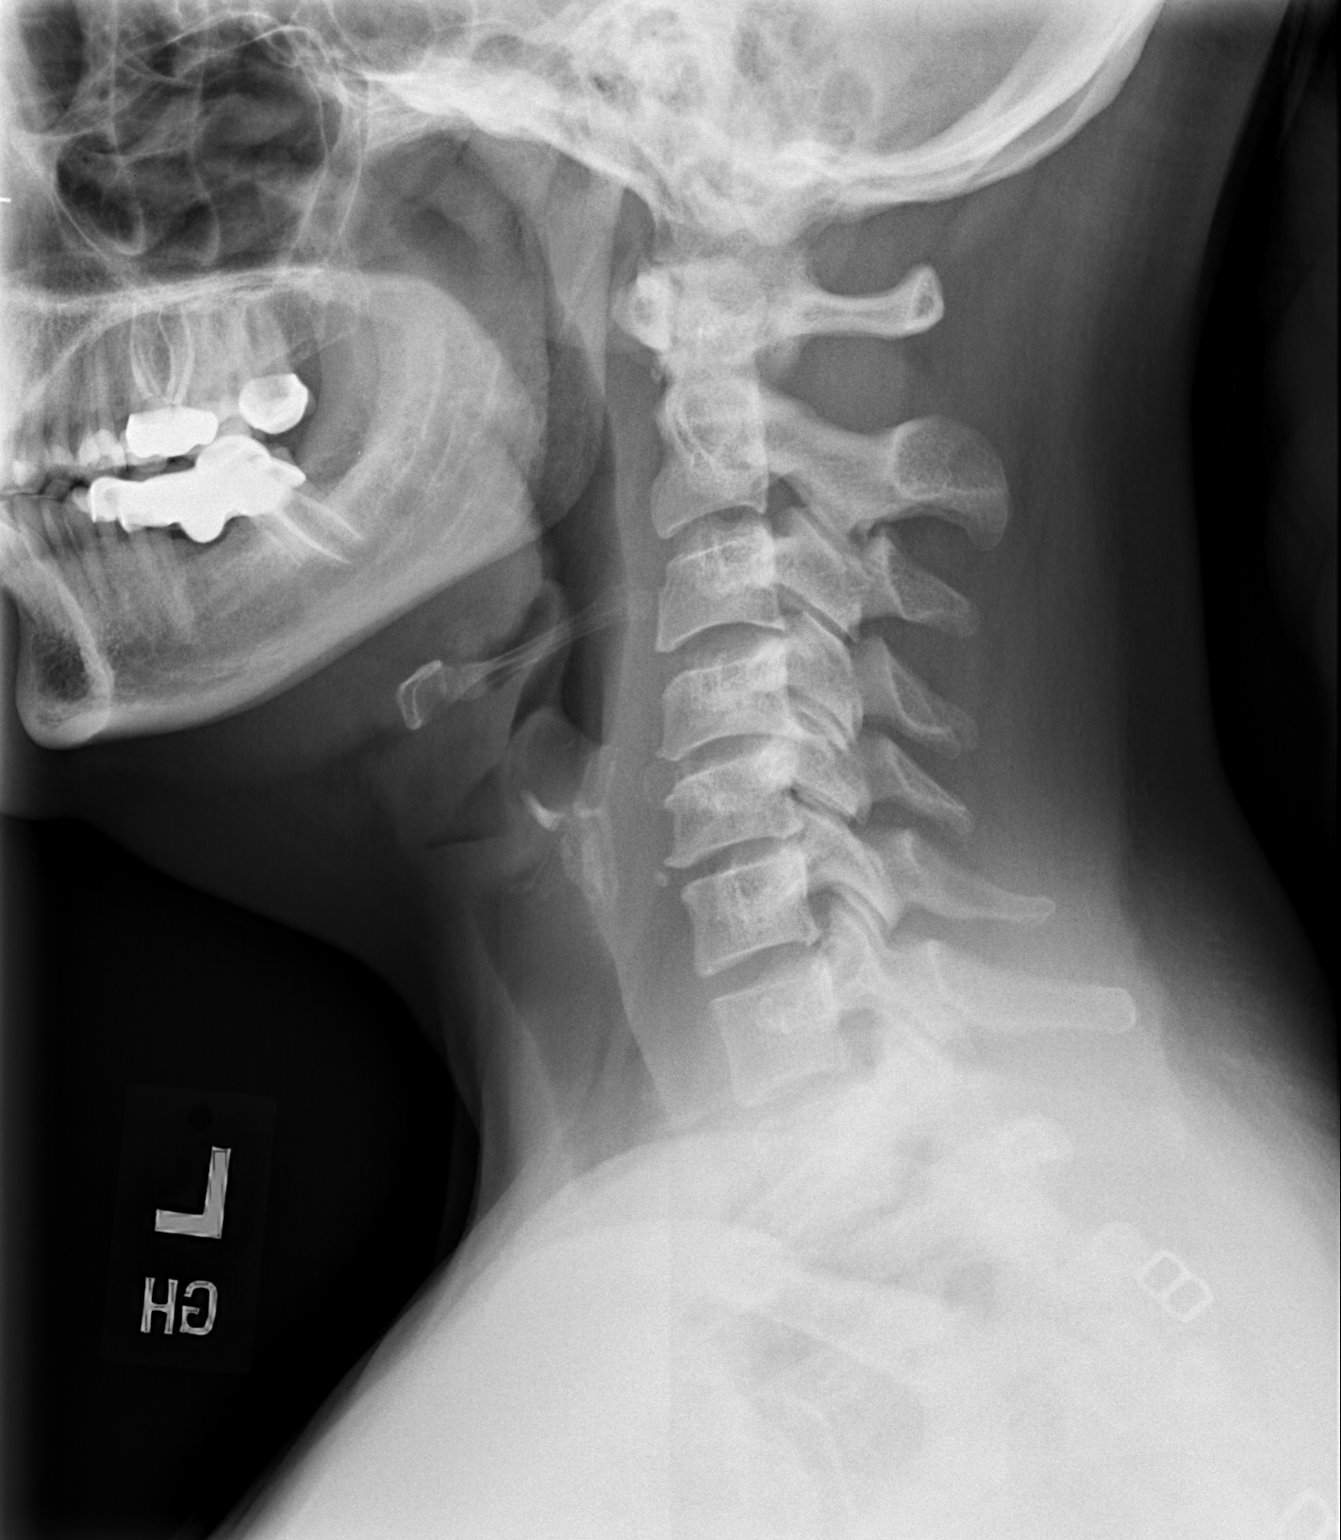

[w c-spine a.p. *]
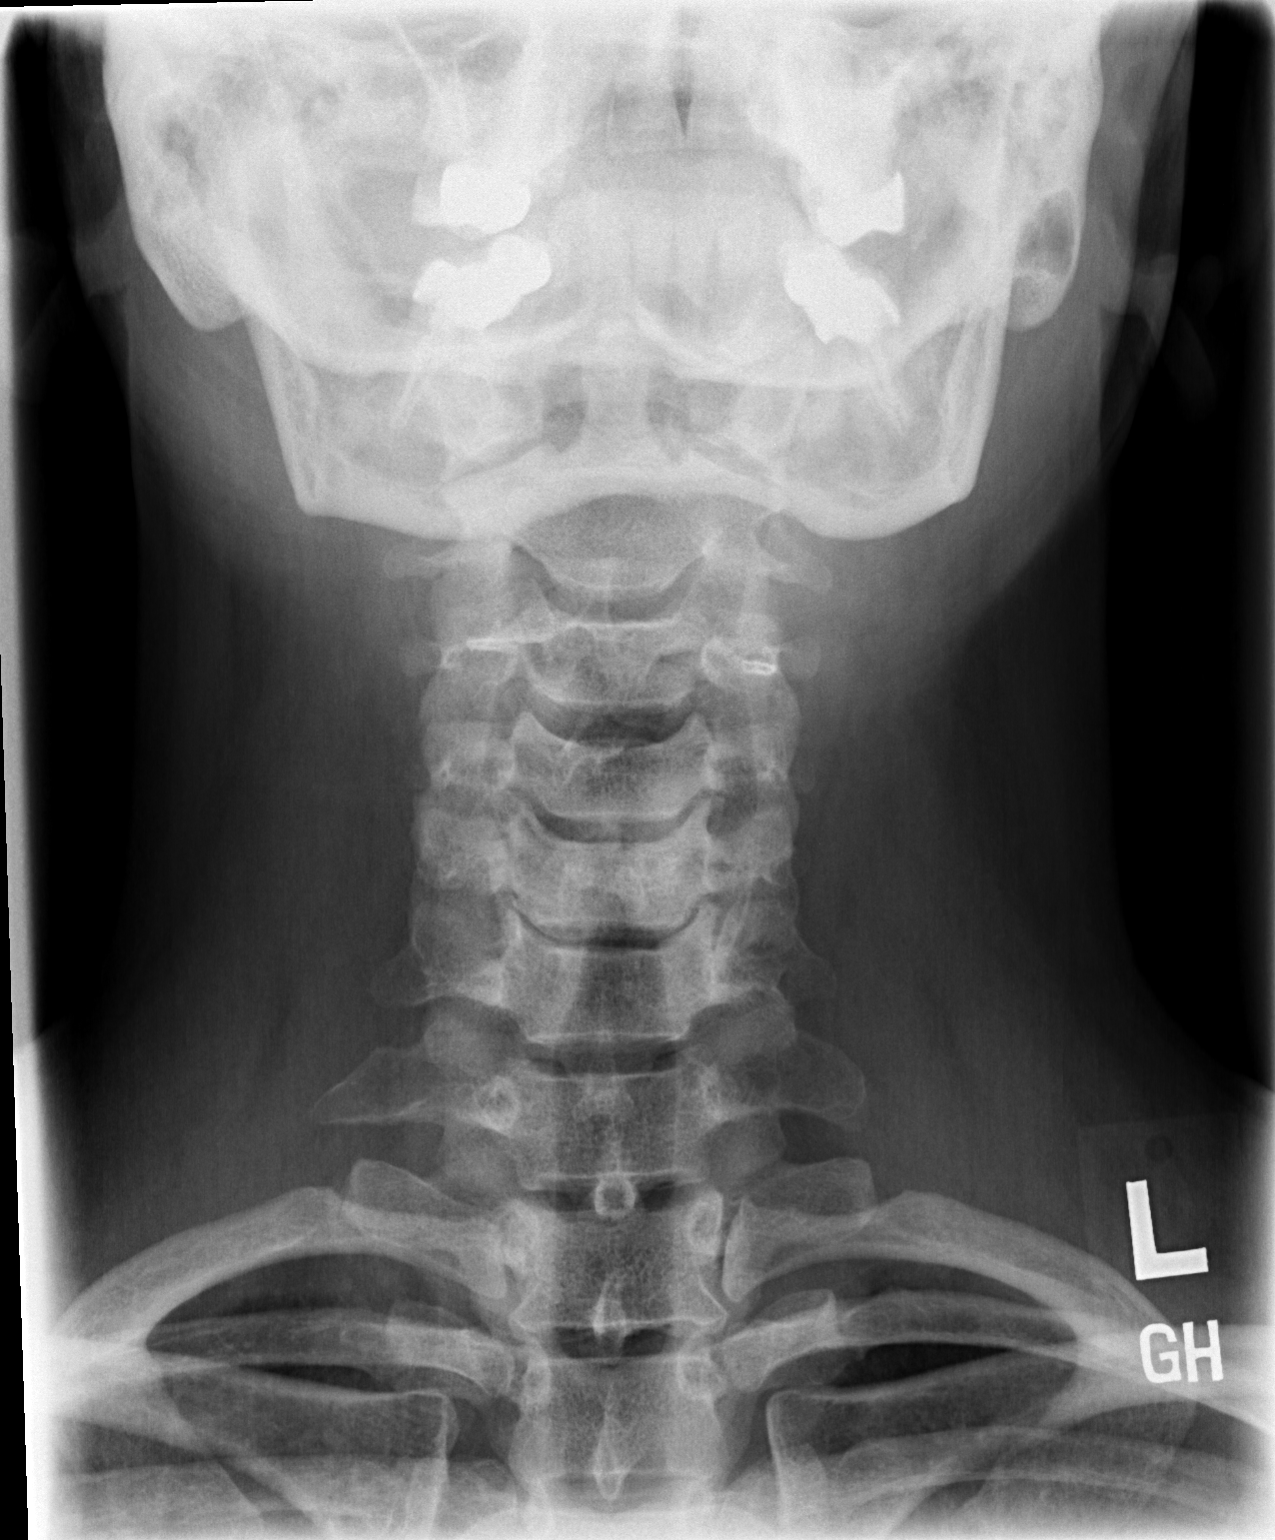

[2 of 2 positions shown; findings below may reference images not displayed]

FINDINGS: There is no evidence of cervical spine fracture or prevertebral soft
tissue swelling. Alignment is normal. Disc spaces are preserved.
Minimal degenerative osteophytes are seen at C4-C5 and C5-C6.
IMPRESSION: 1. No acute bony abnormality.
2. Early degenerative changes of the lower cervical spine.

## 2023-03-22 IMAGING — CR DG LUMBAR SPINE 2-3V
2 series · 2 of 2 positions shown · non-contrast
Comparison: None.

CLINICAL DATA: Pain.

EXAM:
LUMBAR SPINE - 2-3 VIEW

[w l-spine a.p. *]
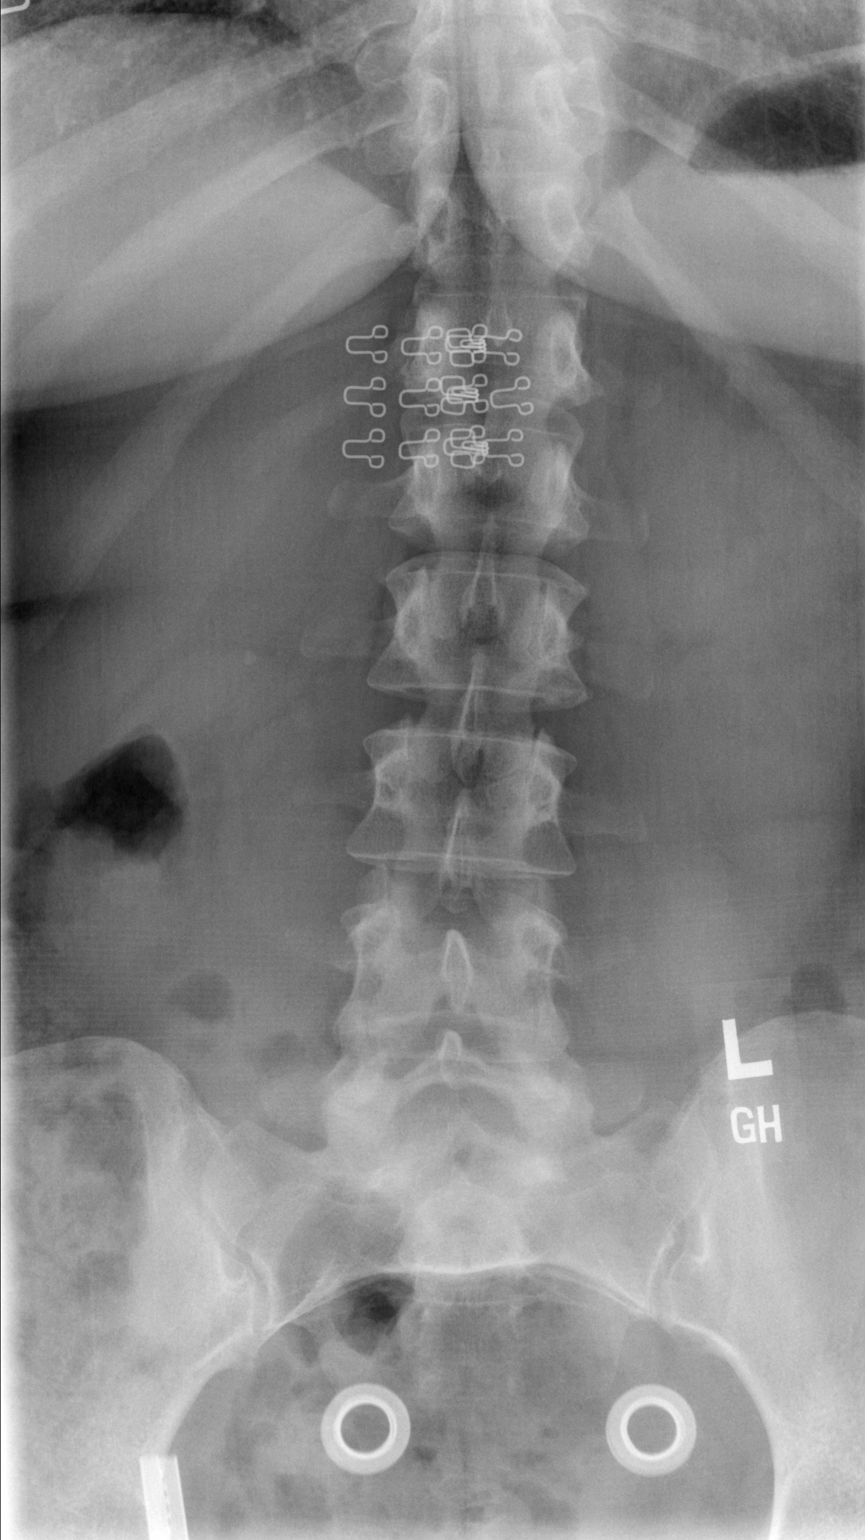

[w l-spine lat *]
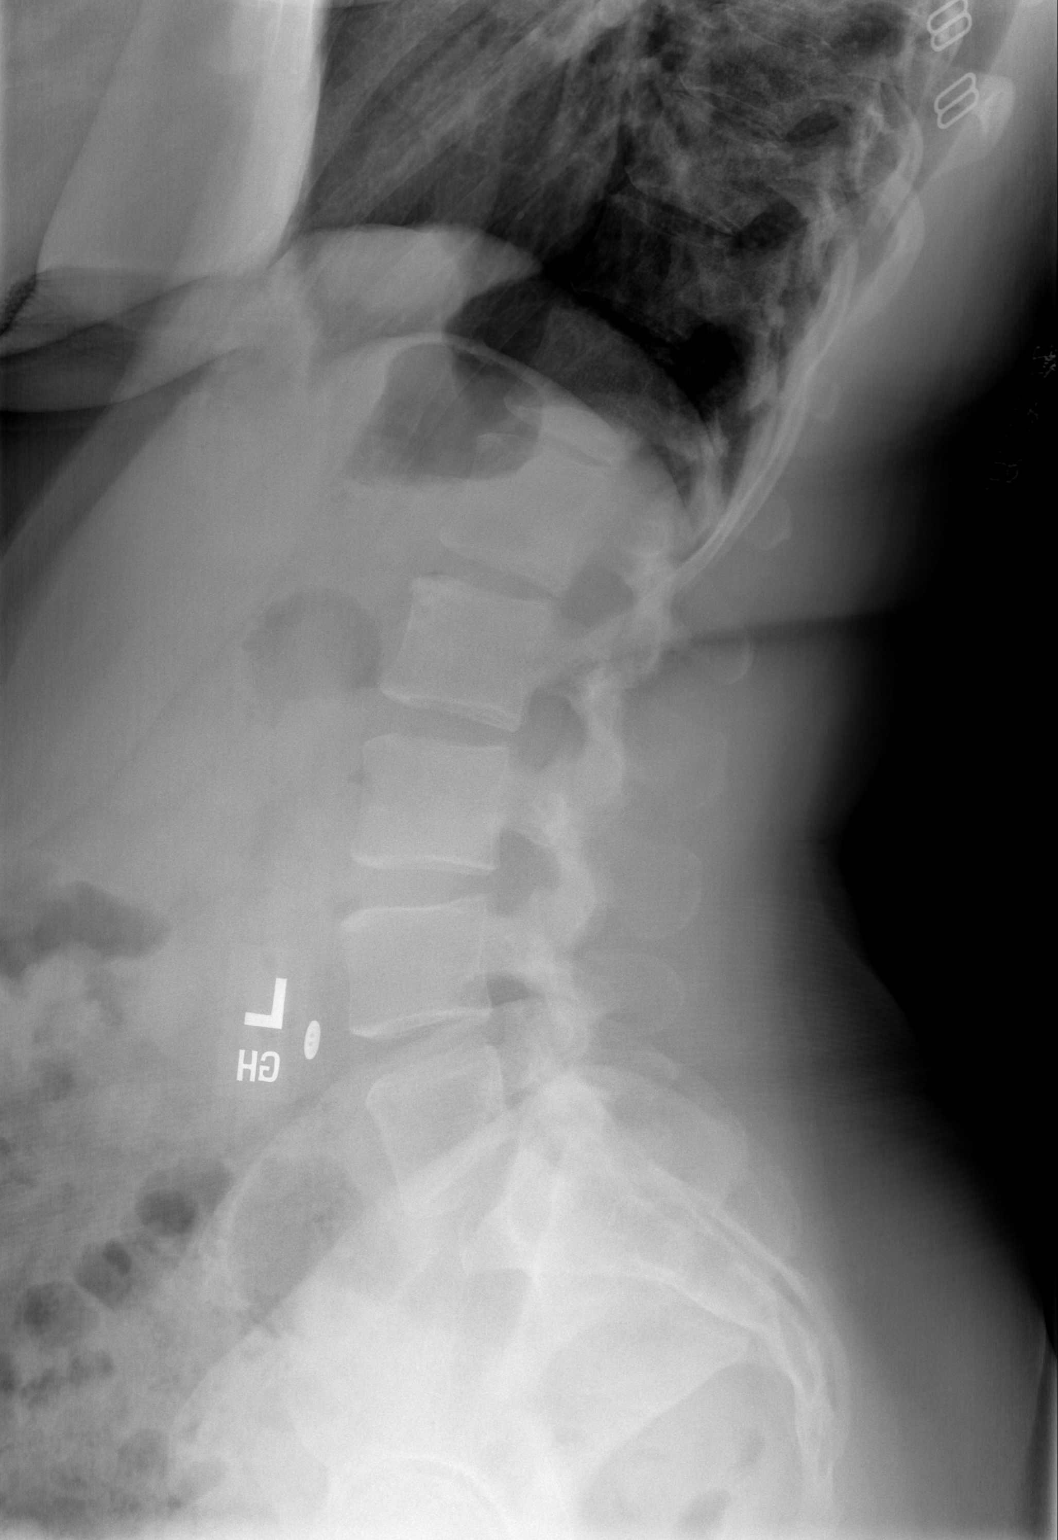

[2 of 2 positions shown; findings below may reference images not displayed]

FINDINGS: There is no evidence of lumbar spine fracture. Alignment is normal.
Intervertebral disc spaces are maintained. Soft tissues are within
normal limits.
IMPRESSION: Negative.

## 2023-10-02 ENCOUNTER — Ambulatory Visit: Payer: 59 | Admitting: Family Medicine

## 2023-10-05 NOTE — Progress Notes (Signed)
 FOLLOW UP Date of Service/Encounter:  10/09/23  Subjective:  Lorraine Wu (DOB: [birth year]) is a 48 y.o. female who [facility] in re-evaluation of the following: mild persistent asthma, idiopathic urticaria, seasonal and perennial allergic rhinitis, and gastroesophageal reflux disease  History obtained from: chart review and patient.  For Review, LV was on 08/29/22  with Nehemiah Settle, FNP seen for routine follow-up. This is my first encounter with this patient. See below for summary of history and diagnostics.   Therapeutic plans/changes recommended: Her FEV1 was 87%, she reported 1 round of systemic steroids from the previous year.  She uses Armin air in block therapy and takes Singulair daily.  She also takes Allegra and Pepcid for her hives, rhinitis and reflux which were controlled on these medications. --------------------------------------------------- Today presents for follow-up. Discussed the use of AI scribe software for clinical note transcription with the patient, who gave verbal consent to proceed.  History of Present Illness   Lorraine Wu is a 48 year old female with asthma and allergies who presents for follow-up. She is accompanied by her husband, Lorraine Wu.  Asthma has been well-controlled over the past year. She has not required the use of her rescue inhaler, Airsupra, except for one occasion over a year ago. No need for steroids or antibiotics since the last visit. She recalls experiencing bronchial spasms and shortness of breath from September to November 2022, following a trip to the [place], but has been stable since then. Occasionally, she experiences shortness of breath triggered by laughter. She is currently taking Singulair daily for asthma management. She also uses Airsupra as needed, which she finds effective.  Regarding her allergies, she reports improvement since switching from Zyrtec to [place], which  she takes once daily. This change has better controlled her nasal and eye symptoms. She has a known shellfish allergy and avoids shellfish to prevent reactions. She requires a refill for her EpiPen.  For her acid reflux, she takes famotidine 20 mg at night, which has been effective in managing her symptoms. She notes that her acid reflux is better controlled than when it first started a few years ago.      All medications reviewed by clinical staff and updated in chart. No new pertinent medical or surgical history except as noted in HPI.  ROS: All others negative except as noted per HPI.   Objective:  BP 108/70 (BP Location: Right Arm, Patient Position: Sitting, Cuff Size: Normal)   Pulse 83   Temp 98.4 F (36.9 C) (Temporal)   Resp 18   Ht 4\' 11"  (1.499 m)   Wt 165 lb 11.2 oz (75.2 kg)   SpO2 97%   BMI 33.47 kg/m  Body mass index is 33.47 kg/m. Physical Exam: General Appearance:  Alert, cooperative, no distress, appears stated age  Head:  Normocephalic, without obvious abnormality, atraumatic  Eyes:  Conjunctiva clear, EOM's intact  Ears EACs normal bilaterally and normal TMs bilaterally  Nose: Nares normal, hypertrophic turbinates, normal mucosa, and no visible anterior polyps  Throat: Lips, tongue normal; teeth and gums normal, normal posterior oropharynx  Neck: Supple, symmetrical  Lungs:   clear to auscultation bilaterally, Respirations unlabored, no coughing  Heart:  regular rate and rhythm and no murmur, Appears well perfused  Extremities: No edema  Skin: Skin color, texture, turgor normal and no rashes or lesions on visualized portions of skin  Neurologic: No gross deficits   Labs:  Lab Orders  No laboratory test(s)  ordered today    Spirometry:  Tracings reviewed. Her effort: Good reproducible efforts. FVC: 2.57L FEV1: 1.78L, 83% predicted FEV1/FVC ratio: 0.69 Interpretation:  spirometry consistent with borderline mild obstruction .  Please see scanned  spirometry results for details.  Assessment/Plan   Asthma-at goal Trial off montelukast.  If symptoms worsen the either for asthma or allergic rhinitis, restart.  Call to let us know so we can send refills. During asthma symptoms start AirSupra taking 2 puffs as needed for cough, wheeze, tightness in chest, or shortness of breath. Do not use more than 12 inhalations in 24 hours.   Allergic rhinitis-at goal Continue allergen avoidance measures directed toward grass pollen, tree pollen dog, cat, dust mite, and cockroach as listed below Continue Allegra 180 mg once a day as needed for a runny nose Consider saline nasal rinses as needed for nasal symptoms. Use this before any medicated nasal sprays for best result Consider allergy injections to reduce lifetime symptoms and need for medications by teaching your immune system to become tolerant of the environmental allergens you are allergic to  Reflux-at goal Continue dietary lifestyle modifications as listed below Continue famotidine 20 mg once or twice a day to control reflux  Food allergy-stable Continue to avoid shellfish. In case of an allergic reaction, take Benadryl 50 mg every 4 hours, and if life-threatening symptoms occur, inject with EpiPen 0.3 mg.   Follow up in 12 months or sooner if needed. It was a pleasure meeting you in clinic today! Thank you for allowing me to participate in your care.  Other: none  Tonny Bollman, MD  [facility]

## 2023-10-09 ENCOUNTER — Ambulatory Visit: Payer: 59 | Admitting: Internal Medicine

## 2023-10-09 ENCOUNTER — Encounter: Payer: Self-pay | Admitting: Internal Medicine

## 2023-10-09 ENCOUNTER — Other Ambulatory Visit: Payer: Self-pay

## 2023-10-09 VITALS — BP 108/70 | HR 83 | Temp 98.4°F | Resp 18 | Ht 59.0 in | Wt 165.7 lb

## 2023-10-09 DIAGNOSIS — K219 Gastro-esophageal reflux disease without esophagitis: Secondary | ICD-10-CM

## 2023-10-09 DIAGNOSIS — T7800XD Anaphylactic reaction due to unspecified food, subsequent encounter: Secondary | ICD-10-CM

## 2023-10-09 DIAGNOSIS — J4530 Mild persistent asthma, uncomplicated: Secondary | ICD-10-CM | POA: Diagnosis not present

## 2023-10-09 DIAGNOSIS — J3089 Other allergic rhinitis: Secondary | ICD-10-CM | POA: Diagnosis not present

## 2023-10-09 DIAGNOSIS — J302 Other seasonal allergic rhinitis: Secondary | ICD-10-CM

## 2023-10-09 MED ORDER — EPINEPHRINE 0.3 MG/0.3ML IJ SOAJ: 0.3000 mg | INTRAMUSCULAR | 1 refills | Status: AC | PRN

## 2023-10-09 MED ORDER — FAMOTIDINE 20 MG PO TABS: 20.0000 mg | ORAL_TABLET | Freq: Two times a day (BID) | ORAL | 11 refills | Status: AC | PRN

## 2023-10-09 MED ORDER — AIRSUPRA 90-80 MCG/ACT IN AERO: 2.0000 | INHALATION_SPRAY | RESPIRATORY_TRACT | 1 refills | Status: AC | PRN

## 2023-10-09 NOTE — Patient Instructions (Addendum)
 Asthma Trial off montelukast.  If symptoms worsen the either for asthma or allergic rhinitis, restart.  Call to let us know so we can send refills. During asthma symptoms start AirSupra taking 2 puffs as needed for cough, wheeze, tightness in chest, or shortness of breath. Do not use more than 12 inhalations in 24 hours.   Allergic rhinitis Continue allergen avoidance measures directed toward grass pollen, tree pollen dog, cat, dust mite, and cockroach as listed below Continue Allegra 180 mg once a day as needed for a runny nose Consider saline nasal rinses as needed for nasal symptoms. Use this before any medicated nasal sprays for best result Consider allergy injections to reduce lifetime symptoms and need for medications by teaching your immune system to become tolerant of the environmental allergens you are allergic to  Reflux Continue dietary lifestyle modifications as listed below Continue famotidine 20 mg once or twice a day to control reflux  Food allergy Continue to avoid shellfish. In case of an allergic reaction, take Benadryl 50 mg every 4 hours, and if life-threatening symptoms occur, inject with EpiPen 0.3 mg.   Follow up in 12 months or sooner if needed. It was a pleasure meeting you in clinic today! Thank you for allowing me to participate in your care.  Tonny Bollman, MD [facility]

## 2024-10-14 ENCOUNTER — Ambulatory Visit: Admitting: Internal Medicine
